# Patient Record
Sex: Male | Born: 1955 | Race: White | Hispanic: No | Marital: Married | Smoking: Never smoker
Health system: Southern US, Community
[De-identification: ages and names within clinical notes are randomized; demographics above are authoritative.]

## PROBLEM LIST (undated history)

## (undated) DIAGNOSIS — K219 Gastro-esophageal reflux disease without esophagitis: Secondary | ICD-10-CM

## (undated) DIAGNOSIS — I10 Essential (primary) hypertension: Secondary | ICD-10-CM

## (undated) DIAGNOSIS — E7800 Pure hypercholesterolemia, unspecified: Secondary | ICD-10-CM

## (undated) HISTORY — PX: FOOT SURGERY: SHX648

## (undated) HISTORY — PX: CHOLECYSTECTOMY: SHX55

---

## 2009-01-19 ENCOUNTER — Ambulatory Visit (HOSPITAL_COMMUNITY): Admission: RE | Admit: 2009-01-19 | Discharge: 2009-01-19 | Payer: Self-pay | Admitting: Family Medicine

## 2010-03-07 ENCOUNTER — Ambulatory Visit (HOSPITAL_COMMUNITY): Admission: RE | Admit: 2010-03-07 | Discharge: 2010-03-07 | Payer: Self-pay | Admitting: Family Medicine

## 2010-03-27 ENCOUNTER — Ambulatory Visit: Payer: Self-pay | Admitting: Internal Medicine

## 2010-03-27 DIAGNOSIS — R1012 Left upper quadrant pain: Secondary | ICD-10-CM

## 2010-03-27 DIAGNOSIS — R933 Abnormal findings on diagnostic imaging of other parts of digestive tract: Secondary | ICD-10-CM

## 2010-03-27 DIAGNOSIS — K219 Gastro-esophageal reflux disease without esophagitis: Secondary | ICD-10-CM | POA: Insufficient documentation

## 2010-03-27 DIAGNOSIS — Z8711 Personal history of peptic ulcer disease: Secondary | ICD-10-CM

## 2010-03-29 ENCOUNTER — Encounter: Payer: Self-pay | Admitting: Internal Medicine

## 2010-04-01 ENCOUNTER — Encounter: Payer: Self-pay | Admitting: Gastroenterology

## 2010-04-08 ENCOUNTER — Ambulatory Visit (HOSPITAL_COMMUNITY): Admission: RE | Admit: 2010-04-08 | Discharge: 2010-04-08 | Payer: Self-pay | Admitting: Internal Medicine

## 2010-04-08 ENCOUNTER — Ambulatory Visit: Payer: Self-pay | Admitting: Internal Medicine

## 2010-04-10 ENCOUNTER — Encounter: Payer: Self-pay | Admitting: Internal Medicine

## 2010-04-10 ENCOUNTER — Ambulatory Visit (HOSPITAL_COMMUNITY): Admission: RE | Admit: 2010-04-10 | Discharge: 2010-04-10 | Payer: Self-pay | Admitting: Internal Medicine

## 2010-04-11 ENCOUNTER — Encounter: Payer: Self-pay | Admitting: Internal Medicine

## 2010-04-24 ENCOUNTER — Encounter: Payer: Self-pay | Admitting: Internal Medicine

## 2010-05-15 ENCOUNTER — Ambulatory Visit: Payer: Self-pay | Admitting: Gastroenterology

## 2010-05-31 ENCOUNTER — Telehealth (INDEPENDENT_AMBULATORY_CARE_PROVIDER_SITE_OTHER): Payer: Self-pay

## 2010-06-07 ENCOUNTER — Encounter: Payer: Self-pay | Admitting: Gastroenterology

## 2010-08-21 ENCOUNTER — Ambulatory Visit (HOSPITAL_COMMUNITY): Admission: RE | Admit: 2010-08-21 | Discharge: 2010-08-21 | Payer: Self-pay | Admitting: Family Medicine

## 2010-12-31 NOTE — Letter (Signed)
Summary: External Other  External Other   Imported By: Peggyann Shoals 04/01/2010 12:58:30  _____________________________________________________________________  External Attachment:    Type:   Image     Comment:   External Document

## 2010-12-31 NOTE — Progress Notes (Signed)
Summary: lost rx  Phone Note Call from Patient Call back at Home Phone [phone]   Caller: Patient Summary of Call: pt called- he lost rx for hemorroids. wants to know if [facility] will [facility]. Initial call taken by: Hendricks Limes LPN,  June 01, 2951 10:02 AM     Appended Document: lost rx-hemorrhoids    Prescriptions: HYDROCORTISONE ACETATE 25 MG SUPP (HYDROCORTISONE ACETATE) anorectally two times a day for two weeks  #28 x 1   Entered and Authorized by:   Leanna Battles. Dixon Boos   Signed by:   Leanna Battles Lewis PA-C on 05/31/2010   Method used:   [facility]ronically to        [facility] [phone]* (retail)       [address]       [place], [place]  [address]       Ph: [phone] or [phone]       Fax: [phone]   RxID:   [id number]

## 2010-12-31 NOTE — Letter (Signed)
Summary: Patient Notice, Endo Biopsy Results  [facility]  [address]   [place], [place] [address]   Phone: [phone]  Fax: [phone]       Apr 11, 2010   Fernando Fisher [address] [place], [place]  [address] Oct 05, 1956    Dear [name],  I am pleased to inform you that the biopsies taken during your recent endoscopic examination did not show any evidence of cancer upon pathologic examination.  There was only some inflammation in your stomach..  Additional information/recommendations:  Continue with the treatment plan as outlined on the day of your exam.  Please call us if you are having persistent problems or have questions about your condition that have not been fully answered at this time.  Sincerely,    R. Roetta Sessions MD, FACP [facility] Ph: [phone]   Fax: [phone]   Appended Document: Patient Notice, Endo Biopsy Results letter already mailed on 04/10/10

## 2010-12-31 NOTE — Letter (Signed)
Summary: Patient Notice, Endo Biopsy Results  [facility]  [address]   [place], [place] [address]   Phone: [phone]  Fax: [phone]       Apr 10, 2010   Fernando Fisher [address] [place], [place]  [address] [birth year]    Dear Fernando Fisher,  I am pleased to inform you that the biopsies taken during your recent endoscopic examination did not show any evidence of cancer upon pathologic examination.  They did show mild inflammation.  Additional information/recommendations:  Continue with the treatment plan as outlined on the day of your exam.  Please call us if you are having persistent problems or have questions about your condition that have not been fully answered at this time.  Sincerely,    R. Roetta Sessions MD, FACP [facility] Ph: [phone]   Fax: [phone]   Appended Document: Patient Notice, Endo Biopsy Results letter mailed to pt

## 2010-12-31 NOTE — Assessment & Plan Note (Signed)
Summary: fu in 4-6 wks/ss   Visit Type:  f/u Primary Care Provider:  [facility]  Chief Complaint:  follow up- still has some abd pain.  History of Present Illness: Mr. Fernando Fisher is here for f/u. He had EGD in 5/11. He had normal esophagus, small to moderate size hiatal hernia, multiple fundal gland type polyps confimed by biopsy, mottled patchy erythema of the gastric mucosa diffusely of uncertain significance (query nonsteroidal anti-inflammatory drug effect), otherwise unremarkable stomach status post biopsy (benign). He was switched from prilosec to dexilant. He feels better. He has less abd pain, now just some on left side. Still pp component but also related to prolonged standing. He has cut back on acidic foods including ketchup. Denies melena, brbpr. C/O anorectal itching the last one week. He also had CT A/P which showed nonspecific 7mm liver lesion.   Current Medications (verified): 1)  Hydrochlorothiazide 25 Mg Tabs (Hydrochlorothiazide) .... 1/2 Tablet Daily 2)  Zetia 10 Mg Tabs (Ezetimibe) .... Take 1 Tablet By Mouth Once A Day 3)  Loratadine 10 Mg Tabs (Loratadine) .... Take 1 Tablet By Mouth Once A Day 4)  Multi-Vitamin .... Take 1 Tablet By Mouth Once A Day 5)  Fish Oil  1000mg  .... Take Four Tablets Daily 6)  Sucralfate 1 Gm/69ml Susp (Sucralfate) .Marland Kitchen.. 10cc By Mouth Qac and At Bedtime Prn 7)  Dexilant 60 Mg Cpdr (Dexlansoprazole) .... Once Daily  Allergies (verified): No Known Drug Allergies  Review of Systems      See HPI  Vital Signs:  Patient profile:   55 year old male Height:      66 inches Weight:      166 pounds BMI:     26.89 Temp:     98.3 degrees F oral Pulse rate:   76 / minute BP sitting:   134 / 92  (left arm) Cuff size:   regular  Vitals Entered By: Hendricks Limes LPN (May 15, 2010 2:40 PM)  Physical Exam  General:  Well developed, well nourished, no acute distress. Head:  Normocephalic and atraumatic. Eyes:  sclera  nonicteric Mouth:  OP moist Abdomen:  Bowel sounds normal.  Abdomen is soft, nontender, nondistended.  No rebound or guarding.  No hepatosplenomegaly, masses or hernias.  No abdominal bruits.  Extremities:  No clubbing, cyanosis, edema or deformities noted. Neurologic:  Alert and  oriented x4;  grossly normal neurologically. Skin:  Intact without significant lesions or rashes. Psych:  Alert and cooperative. Normal mood and affect.  Impression & Recommendations:  Problem # 1:  ABDOMINAL PAIN, LEFT UPPER QUADRANT (ICD-789.02)  Etiology remains unclear but he has noted improvement on Dexilant. ?secondary to mottled appearing stomach, hiatal hernia. Continue Dexilant 60mg  by mouth daily.   Orders: Est. Patient Level II ([address]) Immuno-chemical Fecal Occult (02725)  Problem # 2:  GERD (ICD-530.81)  Continue Dexilant.  Orders: Est. Patient Level II (36644)  Problem # 3:  ADENOCARCINOMA, COLON, FAMILY HX (ICD-V16.0)  Tried to get last TCS report and path from [facility] but not successful with first attempt. Will try again. Patient's mother had colon cancer in her 28s. Patient completed ifobt yesterday which was positive. ?secondary to ugi tract findings. Will try to retrieve report so that we can make decision regarding next TCS.   Orders: Est. Patient Level II (03474) Immuno-chemical Fecal Occult (25956)  Problem # 4:  Sx of HEMORRHOIDS (ICD-455.6)  Anorectal itching. Patient declined on DRE. Trial of anusol.  Orders: Est. Patient Level II ([address]756)  Immuno-chemical Fecal Occult ([address]) Prescriptions: HYDROCORTISONE ACETATE 25 MG SUPP (HYDROCORTISONE ACETATE) anorectally two times a day for two weeks  #28 x 1   Entered and Authorized by:   Leanna Battles. Dixon Boos   Signed by:   Leanna Battles Iker Nuttall PA-C on 05/15/2010   Method used:   Print then Give to Patient   RxID:   [id number]   Appended Document: fu in 4-6 wks/ss Please see if patient needs RX for Dexilant. Should be out of  samples soon. May give RX for Dexilant 60mg  by mouth daily, #30, 5 refills and rebate card.  Appended Document: fu in 4-6 wks/ss Spoke with pt's wife. She said she thought he already had Rx with refills, she will check it out and call back if he needs it. She said he was not eligible for the rebate card.  Appended Document: fu in 4-6 wks/ss I still need TCS report. Please f/u on this. We have requested twice now.   Appended Document: fu in 4-6 wks/ss tcs report in EMR, see 04/24/10 scan  Appended Document: fu in 4-6 wks/ss Review last TCS done 2007 at [facility]. Prep fair. Otherwise unremarkable. FH CRC mother, after age 35s. Heme positive stool may be seconary to ugi tract findings.  Recommend CBC to determine if any anemia. Then decide about next TCS.  Appended Document: fu in 4-6 wks/ss LMOM to call @ cell number, [phone], given by wife.  Appended Document: fu in 4-6 wks/ss Pt was informed. Declined blood work and said that he would get back with [place] later on.  Appended Document: fu in 4-6 wks/ss That would be his decision.

## 2010-12-31 NOTE — Assessment & Plan Note (Signed)
Summary: npp,abd pain,gu   Visit Type:  Initial Consult Referring Provider:  Patrica Duel Primary Care Provider:  Patrica Duel  Chief Complaint:  abdominal pain.  History of Present Illness: Fernando Fisher is a pleasant 55 y/o WM, patient of Dr. Nobie Putnam, who presents for further evaluation of luq abd pain of several months duration. Patient has h/o remote PUD, chronic GERD (recently on omeprazole 40mg  at bedtime and Dr. Nobie Putnam doubled). He c/o pp luq pain which occurs within one hour of meal. Recent increase in omeprazole did not help. He hurts also with standing/walking. Denies injury. He "feels" his food going down through his GI tract. He has "always" had stomach issues. His BMs are more frequent lately. He has 2-3 formed stools per day. No blood in stool or melena. Denies urinary problems. Denies back pain. 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 He denies chest pain, SOB.   He reports normal TCS at [facility] around 2007. His mother had colon cancer, age 68.  Labs 02/26/10--> CBC normal, CMET normal, amylase/lipase normal, H. Pylori negative. Abd U/S 03/07/10--> suboptimal view of liver and pancreas due to extensive bowel gas UGI series 03/07/10-->multiple small rounded filling defects at gastric fundus, ?gastric polyps, predominately sessile and smoothly marginated. Cannot exclude gastritis. Minimal impairment of esophageal dysmotility.  Current Medications (verified): 1)  Asa 81 Mg .... Take 1 Tablet By Mouth Once A Day 2)  Prilosec 20 Mg Cpdr (Omeprazole) .... Four At Bedtime. 3)  Hydrochlorothiazide 25 Mg Tabs (Hydrochlorothiazide) .... 1/2 Tablet Daily 4)  Zetia 10 Mg Tabs (Ezetimibe) .... Take 1 Tablet By Mouth Once A Day 5)  Loratadine 10 Mg Tabs (Loratadine) .... Take 1 Tablet By Mouth Once A Day 6)  Multi-Vitamin .... Take 1 Tablet By Mouth Once A Day 7)  Fish Oil  1000mg  .... Take Four Tablets Daily 8)  Aleve .... As Needed  Allergies (verified): No Known Drug  Allergies  Past History:  Past Medical History: GERD Hyperlipidemia Hypertension TCS, 3/07, [facility] Allergies  Past Surgical History: Cholecystectomy, 1999 Left foot, plantar fasciitis  Family History: Mother, colon cancer,  Fernando Fisher, age 57, diagnosed by Dr. Jena Gauss Father, prostate cancer, PUD, bleeding Mothers side several females with "male" and uterine cancer. Maternal aunt, breast cancer.  Social History: [employer]. Location manager for [employer]. Married. Grown son. Never smoked. No alcohol. No drugs.  Review of Systems General:  Denies fever, chills, sweats, anorexia, fatigue, weakness, and weight loss. Eyes:  Denies vision loss. ENT:  Denies nasal congestion, sore throat, hoarseness, and difficulty swallowing. CV:  Denies chest pains, angina, palpitations, dyspnea on exertion, and peripheral edema. Resp:  Denies dyspnea at rest, dyspnea with exercise, and cough. GI:  See HPI. GU:  Denies urinary burning and blood in urine. MS:  Denies joint pain / LOM. Derm:  Denies rash and itching. Neuro:  Denies weakness, frequent headaches, difficulty walking, memory loss, and confusion. Psych:  Denies depression and anxiety. Endo:  Denies unusual weight change. Heme:  Denies bruising and bleeding. Allergy:  Denies hives and rash.  Vital Signs:  Patient profile:   55 year old male Height:      66 inches Weight:      166 pounds BMI:     26.89 Temp:     98.2 degrees F oral Pulse rate:   80 / minute BP sitting:   118 / 80  (left arm) Cuff size:   regular  Vitals Entered By: Tyler Aas  Roseanne Reno LPN (March 27, 2010 1:56 PM)  Physical Exam  General:  Well developed, well nourished, no acute distress. Head:  Normocephalic and atraumatic. Eyes:  Conjunctivae pink, no scleral icterus.  Mouth:  Oropharyngeal mucosa moist, pink.  No lesions, erythema or exudate.    Neck:  Supple; no masses or thyromegaly. Lungs:  Clear throughout to auscultation. Heart:  Regular rate  and rhythm; no murmurs, rubs,  or bruits. Abdomen:  Bowel sounds normal.  Abdomen is soft, nondistended.  Mild LUQ, left mid abdominal tenderness. No rebound or guarding.  No hepatosplenomegaly, masses or hernias.  No abdominal bruits.  Extremities:  No clubbing, cyanosis, edema or deformities noted. Neurologic:  Alert and  oriented x4;  grossly normal neurologically. Skin:  Intact without significant lesions or rashes. Cervical Nodes:  No significant cervical adenopathy. Psych:  Alert and cooperative. Normal mood and affect.  Impression & Recommendations:  Problem # 1:  ABDOMINAL PAIN, LEFT UPPER QUADRANT (ICD-789.02)  2-3 month h/o luq abd pain. UGI series abnormal, ?gastric polyps/?gastritis. Abd pain with major pp component. Recommend EGD for further evaluation. He also has chronic GERD. Currently taking omeprazole 80mg  at bedtime. Advised him to take 40mg  before breakfast and 40mg  before evening meal for now. Likely can reduce to once daily, await EGD findings. EGD to be performed in near future.  Risks, alternatives, benefits including but not limited to risk of reaction to medications, bleeding, infection, and perforation addressed.  Patient voiced understanding and verbal consent obtained.   Orders: Consultation Level IV ([address])  Problem # 2:  PUD, HX OF (ICD-V12.71)  Remote history. H. Pylori serologies negative. EGD as planned.   Orders: Consultation Level IV (60454)  Problem # 3:  ADENOCARCINOMA, COLON, FAMILY HX (ICD-V16.0)  Mother diagnosed with CRC at age 25. Will retrieve last TCS records. If prep was good, consider next TCS at 10 years from his last one.   Orders: Consultation Level IV (09811) Prescriptions: SUCRALFATE 1 GM/10ML SUSP (SUCRALFATE) 10cc by mouth qac and at bedtime prn  #2 weeks x 0   Entered and Authorized by:   Leanna Battles. Dixon Boos   Signed by:   Leanna Battles Aynsley Fleet PA-C on 03/27/2010   Method used:   [facility]ron[facility]        [facility] [phone][facility]  (retail)       [address]       [place], [place]  [address]       Ph: [phone] or [phone]       Fax: [phone]   RxID:   [id number]  I would like to thank Dr. Nobie Putnam for allowing [place] to take part in the care of this nice patient.  Appended Document: npp,abd pain,gu reminder in computer  Appended Document: npp,abd pain,gu Still waiting for TCS and path report from [place] done in 2007.  Patient also needs to complete ifobt prior to next OV.  Appended Document: npp,abd pain,gu LMOM to call, iFOBT bottle at front.  Appended Document: npp,abd pain,gu Pt informed.  Appended Document: npp,abd pain,gu pt cancelled his appt w/RMR on 6/7 because of a conflict in his work schedule. I RSCed pt to 6/15 @2 :30p w/LSL to suit time frame of being seen within 4-6 weeks. Pt is aware of new appt time and date.

## 2010-12-31 NOTE — Letter (Signed)
Summary: EGD ORDER  EGD ORDER   Imported By: Ave Filter 03/27/2010 15:02:40  _____________________________________________________________________  External Attachment:    Type:   Image     Comment:   External Document

## 2010-12-31 NOTE — Letter (Signed)
Summary: External Other  External Other   Imported By: Peggyann Shoals 04/24/2010 09:33:45  _____________________________________________________________________  External Attachment:    Type:   Image     Comment:   External Document

## 2010-12-31 NOTE — Letter (Signed)
Summary: Internal Other  Internal Other   Imported By: Peggyann Shoals 03/29/2010 10:09:17  _____________________________________________________________________  External Attachment:    Type:   Image     Comment:   External Document

## 2010-12-31 NOTE — Miscellaneous (Signed)
Summary: Orders Update  Clinical Lists Changes  Orders: Added new Test order of T-CBC w/Diff (85025-10010) - Signed 

## 2011-03-10 ENCOUNTER — Other Ambulatory Visit (HOSPITAL_COMMUNITY): Payer: Self-pay | Admitting: Orthopedic Surgery

## 2011-03-10 DIAGNOSIS — R52 Pain, unspecified: Secondary | ICD-10-CM

## 2011-03-12 ENCOUNTER — Ambulatory Visit (HOSPITAL_COMMUNITY)
Admission: RE | Admit: 2011-03-12 | Discharge: 2011-03-12 | Disposition: A | Discharge status: Home / Self Care | Payer: PRIVATE HEALTH INSURANCE | Source: Ambulatory Visit | Attending: Orthopedic Surgery | Admitting: Orthopedic Surgery

## 2011-03-12 DIAGNOSIS — M719 Bursopathy, unspecified: Secondary | ICD-10-CM | POA: Insufficient documentation

## 2011-03-12 DIAGNOSIS — M25519 Pain in unspecified shoulder: Secondary | ICD-10-CM | POA: Insufficient documentation

## 2011-03-12 DIAGNOSIS — R52 Pain, unspecified: Secondary | ICD-10-CM

## 2011-03-12 DIAGNOSIS — M67919 Unspecified disorder of synovium and tendon, unspecified shoulder: Secondary | ICD-10-CM | POA: Insufficient documentation

## 2011-03-19 ENCOUNTER — Ambulatory Visit (HOSPITAL_COMMUNITY)
Admission: RE | Admit: 2011-03-19 | Discharge: 2011-03-19 | Disposition: A | Discharge status: Home / Self Care | Payer: PRIVATE HEALTH INSURANCE | Source: Ambulatory Visit | Attending: Specialist | Admitting: Specialist

## 2011-03-19 DIAGNOSIS — M25519 Pain in unspecified shoulder: Secondary | ICD-10-CM | POA: Insufficient documentation

## 2011-03-19 DIAGNOSIS — M25619 Stiffness of unspecified shoulder, not elsewhere classified: Secondary | ICD-10-CM | POA: Insufficient documentation

## 2011-03-19 DIAGNOSIS — I10 Essential (primary) hypertension: Secondary | ICD-10-CM | POA: Insufficient documentation

## 2011-03-19 DIAGNOSIS — IMO0001 Reserved for inherently not codable concepts without codable children: Secondary | ICD-10-CM | POA: Insufficient documentation

## 2011-03-19 DIAGNOSIS — M6281 Muscle weakness (generalized): Secondary | ICD-10-CM | POA: Insufficient documentation

## 2011-03-24 ENCOUNTER — Ambulatory Visit (HOSPITAL_COMMUNITY)
Admission: RE | Admit: 2011-03-24 | Discharge: 2011-03-24 | Disposition: A | Discharge status: Home / Self Care | Payer: PRIVATE HEALTH INSURANCE | Source: Ambulatory Visit | Attending: Family Medicine | Admitting: Family Medicine

## 2011-03-26 ENCOUNTER — Ambulatory Visit (HOSPITAL_COMMUNITY)
Admission: RE | Admit: 2011-03-26 | Discharge: 2011-03-26 | Disposition: A | Discharge status: Home / Self Care | Payer: PRIVATE HEALTH INSURANCE | Source: Ambulatory Visit | Attending: Family Medicine | Admitting: Family Medicine

## 2011-03-31 ENCOUNTER — Ambulatory Visit (HOSPITAL_COMMUNITY): Payer: PRIVATE HEALTH INSURANCE | Admitting: Specialist

## 2011-04-01 ENCOUNTER — Ambulatory Visit (HOSPITAL_COMMUNITY)
Admission: RE | Admit: 2011-04-01 | Discharge: 2011-04-01 | Disposition: A | Discharge status: Home / Self Care | Payer: PRIVATE HEALTH INSURANCE | Source: Ambulatory Visit | Attending: Specialist | Admitting: Specialist

## 2011-04-01 DIAGNOSIS — M6281 Muscle weakness (generalized): Secondary | ICD-10-CM | POA: Insufficient documentation

## 2011-04-01 DIAGNOSIS — I10 Essential (primary) hypertension: Secondary | ICD-10-CM | POA: Insufficient documentation

## 2011-04-01 DIAGNOSIS — M25619 Stiffness of unspecified shoulder, not elsewhere classified: Secondary | ICD-10-CM | POA: Insufficient documentation

## 2011-04-01 DIAGNOSIS — M25519 Pain in unspecified shoulder: Secondary | ICD-10-CM | POA: Insufficient documentation

## 2011-04-01 DIAGNOSIS — IMO0001 Reserved for inherently not codable concepts without codable children: Secondary | ICD-10-CM | POA: Insufficient documentation

## 2011-04-10 ENCOUNTER — Ambulatory Visit (HOSPITAL_COMMUNITY)
Admission: RE | Admit: 2011-04-10 | Discharge: 2011-04-10 | Disposition: A | Discharge status: Home / Self Care | Payer: PRIVATE HEALTH INSURANCE | Source: Ambulatory Visit | Attending: Family Medicine | Admitting: Family Medicine

## 2011-04-11 ENCOUNTER — Ambulatory Visit (HOSPITAL_COMMUNITY)
Admission: RE | Admit: 2011-04-11 | Discharge: 2011-04-11 | Disposition: A | Discharge status: Home / Self Care | Payer: PRIVATE HEALTH INSURANCE | Source: Ambulatory Visit | Attending: Family Medicine | Admitting: Family Medicine

## 2014-03-08 ENCOUNTER — Ambulatory Visit (HOSPITAL_COMMUNITY)
Admission: RE | Admit: 2014-03-08 | Discharge: 2014-03-08 | Disposition: A | Discharge status: Home / Self Care | Payer: PRIVATE HEALTH INSURANCE | Source: Ambulatory Visit | Attending: Internal Medicine | Admitting: Internal Medicine

## 2014-03-08 ENCOUNTER — Other Ambulatory Visit (HOSPITAL_COMMUNITY): Payer: Self-pay | Admitting: Internal Medicine

## 2014-03-08 DIAGNOSIS — R110 Nausea: Secondary | ICD-10-CM | POA: Insufficient documentation

## 2014-03-08 DIAGNOSIS — R109 Unspecified abdominal pain: Secondary | ICD-10-CM | POA: Insufficient documentation

## 2015-10-19 ENCOUNTER — Encounter (HOSPITAL_COMMUNITY): Payer: Self-pay

## 2015-10-19 ENCOUNTER — Emergency Department (HOSPITAL_COMMUNITY)
Admission: EM | Admit: 2015-10-19 | Discharge: 2015-10-19 | Disposition: A | Discharge status: Home / Self Care | Payer: PRIVATE HEALTH INSURANCE | Attending: Emergency Medicine | Admitting: Emergency Medicine

## 2015-10-19 DIAGNOSIS — Y9289 Other specified places as the place of occurrence of the external cause: Secondary | ICD-10-CM | POA: Insufficient documentation

## 2015-10-19 DIAGNOSIS — W260XXA Contact with knife, initial encounter: Secondary | ICD-10-CM | POA: Diagnosis not present

## 2015-10-19 DIAGNOSIS — Y9389 Activity, other specified: Secondary | ICD-10-CM | POA: Insufficient documentation

## 2015-10-19 DIAGNOSIS — S61211A Laceration without foreign body of left index finger without damage to nail, initial encounter: Secondary | ICD-10-CM | POA: Insufficient documentation

## 2015-10-19 DIAGNOSIS — Y998 Other external cause status: Secondary | ICD-10-CM | POA: Diagnosis not present

## 2015-10-19 DIAGNOSIS — Z7982 Long term (current) use of aspirin: Secondary | ICD-10-CM | POA: Insufficient documentation

## 2015-10-19 DIAGNOSIS — E7800 Pure hypercholesterolemia, unspecified: Secondary | ICD-10-CM | POA: Insufficient documentation

## 2015-10-19 DIAGNOSIS — S61219A Laceration without foreign body of unspecified finger without damage to nail, initial encounter: Secondary | ICD-10-CM

## 2015-10-19 DIAGNOSIS — I10 Essential (primary) hypertension: Secondary | ICD-10-CM | POA: Diagnosis not present

## 2015-10-19 HISTORY — DX: Essential (primary) hypertension: I10

## 2015-10-19 HISTORY — DX: Pure hypercholesterolemia, unspecified: E78.00

## 2015-10-19 MED ORDER — LIDOCAINE HCL (PF) 2 % IJ SOLN
2.0000 mL | Freq: Once | INTRAMUSCULAR | Status: AC
  Administered 2015-10-19: 2 mL
  Filled 2015-10-19: qty 10

## 2015-10-19 NOTE — ED Notes (Signed)
Pt made aware to return if symptoms worsen or if any life threatening symptoms occur.   

## 2015-10-19 NOTE — ED Notes (Signed)
Pt reports was working on insulation under his house and accidentally cut left index finger with knife.

## 2015-10-19 NOTE — ED Provider Notes (Signed)
CSN: [id number]     Arrival date & time 10/19/15  1517 History   First MD Initiated Contact with Patient 10/19/15 1546     Chief Complaint  Patient presents with  . Laceration     (Consider location/radiation/quality/duration/timing/severity/associated sxs/prior Treatment) Patient is a 59 y.o. male presenting with skin laceration. The history is provided by the patient.  Laceration Location:  Finger Finger laceration location:  L index finger Length (cm):  1 Depth:  Cutaneous (curved/flap laceration) Bleeding: controlled with pressure   Time since incident:  1 hour Laceration mechanism:  Knife Pain details:    Quality:  Throbbing   Severity:  Mild   Timing:  Constant   Progression:  Unchanged Foreign body present:  No foreign bodies Relieved by:  Pressure Worsened by:  Nothing tried Ineffective treatments:  None tried Tetanus status:  Up to date   Past Medical History  Diagnosis Date  . Hypertension   . Hypercholesterolemia    Past Surgical History  Procedure Laterality Date  . Cholecystectomy    . Foot surgery     No family history on file. Social History  Substance Use Topics  . Smoking status: Never Smoker   . Smokeless tobacco: None  . Alcohol Use: No    Review of Systems  Constitutional: Negative for fever and chills.  Respiratory: Negative for shortness of breath and wheezing.   Skin: Positive for wound.  Neurological: Negative for numbness.      Allergies  Review of patient's allergies indicates no known allergies.  Home Medications   Prior to Admission medications   Medication Sig Start Date End Date Taking? Authorizing Provider  aspirin EC 81 MG tablet Take 81 mg by mouth every evening.   Yes Historical Provider, MD  hydrochlorothiazide (HYDRODIURIL) 25 MG tablet Take 25 mg by mouth at bedtime.   Yes Historical Provider, MD  MAGNESIUM PO Take 1 tablet by mouth every evening.   Yes Historical Provider, MD  Omega-3 Fatty Acids (FISH OIL PO)  Take 1 capsule by mouth every evening.   Yes Historical Provider, MD  simvastatin (ZOCOR) 10 MG tablet Take 10 mg by mouth at bedtime.   Yes Historical Provider, MD   BP 154/82 mmHg  Pulse 94  Temp(Src) 98.2 F (36.8 C) (Oral)  Resp 13  Ht 5\' 6"  (1.676 m)  Wt 155 lb (70.308 kg)  BMI 25.03 kg/m2  SpO2 100% Physical Exam  Constitutional: He is oriented to person, place, and time. He appears well-developed and well-nourished.  HENT:  Head: Normocephalic.  Cardiovascular: Normal rate.   Pulmonary/Chest: Effort normal.  Musculoskeletal: He exhibits no tenderness.  Neurological: He is alert and oriented to person, place, and time. No sensory deficit.  Skin: Laceration noted.  1 cm curved laceration left distal volar index finger.  Distal sensation is intact.    ED Course  Procedures (including critical care time)  LACERATION REPAIR Performed by: Evalee Jefferson Authorized by: Evalee Jefferson Consent: Verbal consent obtained. Risks and benefits: risks, benefits and alternatives were discussed Consent given by: patient Patient identity confirmed: provided demographic data Prepped and Draped in normal sterile fashion Wound explored  Laceration Location: left index finger  Laceration Length: 1cm  No Foreign Bodies seen or palpated  Anesthesia: digital block  Local anesthetic: lidocaine 2% without epinephrine  Anesthetic total: 2 ml  Irrigation method: syringe Amount of cleaning: standard  Skin closure: Ethilon 4-0   Number of sutures: 4   Technique: Simple interrupted   Patient tolerance:  Patient tolerated the procedure well with no immediate complications.  Labs Review Labs Reviewed - No data to display  Imaging Review No results found. I have personally reviewed and evaluated these images and lab results as part of my medical decision-making.   EKG Interpretation None      MDM   Final diagnoses:  Laceration of finger of left hand, initial encounter     Wound care instructions given.  Pt advised to have sutures removed in 10 days,  Return here sooner for any signs of infection including redness, swelling, worse pain or drainage of pus.       Evalee Jefferson, PA-C 10/19/15 1628  Santiel Ferguson, MD 10/19/15 2227

## 2015-10-19 NOTE — ED Notes (Signed)
Dressing applied to finger 

## 2015-10-19 NOTE — Discharge Instructions (Signed)

## 2016-01-20 ENCOUNTER — Encounter (HOSPITAL_COMMUNITY): Payer: Self-pay | Admitting: Emergency Medicine

## 2016-01-20 ENCOUNTER — Emergency Department (HOSPITAL_COMMUNITY): Payer: PRIVATE HEALTH INSURANCE

## 2016-01-20 ENCOUNTER — Emergency Department (HOSPITAL_COMMUNITY)
Admission: EM | Admit: 2016-01-20 | Discharge: 2016-01-20 | Disposition: A | Discharge status: Home / Self Care | Payer: PRIVATE HEALTH INSURANCE | Attending: Emergency Medicine | Admitting: Emergency Medicine

## 2016-01-20 DIAGNOSIS — J111 Influenza due to unidentified influenza virus with other respiratory manifestations: Secondary | ICD-10-CM | POA: Insufficient documentation

## 2016-01-20 DIAGNOSIS — I10 Essential (primary) hypertension: Secondary | ICD-10-CM | POA: Diagnosis not present

## 2016-01-20 DIAGNOSIS — Z7982 Long term (current) use of aspirin: Secondary | ICD-10-CM | POA: Insufficient documentation

## 2016-01-20 DIAGNOSIS — E7800 Pure hypercholesterolemia, unspecified: Secondary | ICD-10-CM | POA: Diagnosis not present

## 2016-01-20 DIAGNOSIS — R000 Tachycardia, unspecified: Secondary | ICD-10-CM | POA: Diagnosis not present

## 2016-01-20 DIAGNOSIS — R509 Fever, unspecified: Secondary | ICD-10-CM | POA: Diagnosis present

## 2016-01-20 MED ORDER — IBUPROFEN 800 MG PO TABS: 800.0000 mg | ORAL_TABLET | Freq: Three times a day (TID) | ORAL | Status: DC

## 2016-01-20 MED ORDER — IBUPROFEN 800 MG PO TABS
800.0000 mg | ORAL_TABLET | Freq: Once | ORAL | Status: AC
  Administered 2016-01-20: 800 mg via ORAL
  Filled 2016-01-20: qty 1

## 2016-01-20 MED ORDER — OSELTAMIVIR PHOSPHATE 75 MG PO CAPS: 75.0000 mg | ORAL_CAPSULE | Freq: Two times a day (BID) | ORAL | Status: DC

## 2016-01-20 NOTE — ED Notes (Signed)
Patient c/o fever, body aches, and productive cough. Per patient temp 102 this morning. Patient states took tylenol last night but nothing this morning. Patient reports thick, dark mucus.

## 2016-01-20 NOTE — ED Provider Notes (Signed)
CSN: [id number]     Arrival date & time 01/20/16  0813 History   By signing my name below, I, Jolayne Panther, attest that this documentation has been prepared under the direction and in the presence of Noemi Chapel, MD. Electronically Signed: Jolayne Panther, Scribe. 01/20/2016. 9:10 AM.   Chief Complaint  Patient presents with  . Fever   The history is provided by the patient. No language interpreter was used.    HPI Comments: Fernando Fisher is a 60 y.o. male who presents to the Emergency Department complaining of sudden onset, gradually worsening productive cough with thick dark sputum which began about three days ago. Pt also notes associated myalgias, nasal congestion, and a fever which was measured at about 102 this morning, less than 48 hours. Pt also reports decreased oral intake but he has had fluids. He states that he took tylenol yesterday with no relief.    Past Medical History  Diagnosis Date  . Hypertension   . Hypercholesterolemia    Past Surgical History  Procedure Laterality Date  . Cholecystectomy    . Foot surgery     History reviewed. No pertinent family history. Social History  Substance Use Topics  . Smoking status: Never Smoker   . Smokeless tobacco: Never Used  . Alcohol Use: No    Review of Systems  Constitutional: Positive for fever.  HENT: Positive for congestion.   Respiratory: Positive for cough.   Musculoskeletal: Positive for myalgias.  All other systems reviewed and are negative.  Allergies  Review of patient's allergies indicates no known allergies.  Home Medications   Prior to Admission medications   Medication Sig Start Date End Date Taking? Authorizing Provider  aspirin EC 81 MG tablet Take 81 mg by mouth every evening.    Historical Provider, MD  hydrochlorothiazide (HYDRODIURIL) 25 MG tablet Take 25 mg by mouth at bedtime.    Historical Provider, MD  ibuprofen (ADVIL,MOTRIN) 800 MG tablet Take 1 tablet (800 mg total) by  mouth 3 (three) times daily. 01/20/16   Noemi Chapel, MD  MAGNESIUM PO Take 1 tablet by mouth every evening.    Historical Provider, MD  Omega-3 Fatty Acids (FISH OIL PO) Take 1 capsule by mouth every evening.    Historical Provider, MD  oseltamivir (TAMIFLU) 75 MG capsule Take 1 capsule (75 mg total) by mouth every 12 (twelve) hours. 01/20/16   Noemi Chapel, MD  simvastatin (ZOCOR) 10 MG tablet Take 10 mg by mouth at bedtime.    Historical Provider, MD   BP 143/84 mmHg  Pulse 120  Temp(Src) 101.5 F (38.6 C) (Oral)  Resp 20  Ht 5\' 6"  (1.676 m)  Wt 155 lb (70.308 kg)  BMI 25.03 kg/m2  SpO2 99% Physical Exam  Constitutional: He appears well-developed and well-nourished. No distress.  HENT:  Head: Normocephalic and atraumatic.  Mouth/Throat: Oropharynx is clear and moist. No oropharyngeal exudate.  Eyes: Conjunctivae and EOM are normal. Pupils are equal, round, and reactive to light. Right eye exhibits no discharge. Left eye exhibits no discharge. No scleral icterus.  Neck: Normal range of motion. Neck supple. No JVD present. No thyromegaly present.  Cardiovascular: Regular rhythm, normal heart sounds and intact distal pulses.  Exam reveals no gallop and no friction rub.   No murmur heard. Tachycardia  Pulmonary/Chest: Effort normal and breath sounds normal. No respiratory distress. He has no wheezes. He has no rales.  No increased work of breathing, no tachypnea, no accessory muscle use, no  respiratory distress, no abnormal lung sounds Occasional coughing Strong pulses   Abdominal: Soft. Bowel sounds are normal. He exhibits no distension and no mass. There is no tenderness.  Musculoskeletal: Normal range of motion. He exhibits no edema or tenderness.  Lymphadenopathy:    He has no cervical adenopathy.  Neurological: He is alert. Coordination normal.  Skin: Skin is warm and dry. No rash noted. He is not diaphoretic. No erythema.  Psychiatric: He has a normal mood and affect. His  behavior is normal.  Nursing note and vitals reviewed.   ED Course  Procedures  DIAGNOSTIC STUDIES:    Oxygen Saturation is 100% on RA, normal by my interpretation.   COORDINATION OF CARE:  8:28 AM Will review x ray. Will prescribe pt medication. Discussed treatment plan with pt at bedside and pt agreed to plan.   Imaging Review Dg Chest 2 View  01/20/2016  CLINICAL DATA:  Productive cough, fever, myalgias. EXAM: CHEST  2 VIEW COMPARISON:  03/08/2014. FINDINGS: Normal sized heart. Mildly tortuous aorta. Clear lungs. Mild central peribronchial thickening. Mild lower thoracic spine degenerative changes. Cholecystectomy clips. IMPRESSION: Mild bronchitic changes. Electronically Signed   By: Claudie Revering M.D.   On: 01/20/2016 09:07   I have personally reviewed and evaluated these images as part of my medical decision-making.   MDM   Final diagnoses:  Influenza    Fwever and tachycardia Pt given meds Has neg xray - classic influenza illnes Described tx plan to patient including indications for return - he has expressed his understanding Stable for d/c Pt in agreement with plan.  Meds given in ED:  Medications  ibuprofen (ADVIL,MOTRIN) tablet 800 mg (800 mg Oral Given 01/20/16 0926)    Discharge Medication List as of 01/20/2016  9:44 AM    START taking these medications   Details  ibuprofen (ADVIL,MOTRIN) 800 MG tablet Take 1 tablet (800 mg total) by mouth 3 (three) times daily., Starting 01/20/2016, Until Discontinued, Print    oseltamivir (TAMIFLU) 75 MG capsule Take 1 capsule (75 mg total) by mouth every 12 (twelve) hours., Starting 01/20/2016, Until Discontinued, Print            Noemi Chapel, MD 01/20/16 [phone]

## 2016-01-20 NOTE — Discharge Instructions (Signed)
Tylenol maximum dose is 1000mg  every 6 hours Motrin max dose is 800mg  every 8 hours

## 2016-03-03 ENCOUNTER — Telehealth: Payer: Self-pay

## 2016-03-03 NOTE — Telephone Encounter (Signed)
[place] SOMETIME TO DISCUSS TCS, RECEIVED LETTER

## 2016-03-05 NOTE — Telephone Encounter (Signed)
Pt is checking his insurance and he will call me back or come by to schedule the colonoscopy.

## 2016-03-06 ENCOUNTER — Telehealth: Payer: Self-pay

## 2016-03-06 NOTE — Telephone Encounter (Signed)
Gastroenterology Pre-Procedure Review  Request Date: 03/06/2016 Requesting Physician: Earleen Newport, NP  ( [place])   PATIENT REVIEW QUESTIONS: The patient responded to the following health history questions as indicated:    Pt said he had his last colonoscopy about 10 years ago at the [place] and it was normal He does have a family hx of colon cancer in his mom/ she was diagnosed in her early 74's  1. Diabetes Melitis: no 2. Joint replacements in the past 12 months: no 3. Major health problems in the past 3 months: no 4. Has an artificial valve or MVP: no 5. Has a defibrillator: no 6. Has been advised in past to take antibiotics in advance of a procedure like teeth cleaning: no 7. Family history of colon cancer: no  8. Alcohol Use: no 9. History of sleep apnea: no     MEDICATIONS & ALLERGIES:    Patient reports the following regarding taking any blood thinners:   Plavix? no Aspirin? YES Coumadin? no  Patient confirms/reports the following medications:  Current Outpatient Prescriptions  Medication Sig Dispense Refill  . ALPRAZolam (XANAX) 0.25 MG tablet Take 0.25 mg by mouth at bedtime as needed for anxiety. Takes only about one tablet monthly    . aspirin EC 81 MG tablet Take 81 mg by mouth every evening.    . hydrochlorothiazide (HYDRODIURIL) 25 MG tablet Take 25 mg by mouth at bedtime.    Marland Kitchen ibuprofen (ADVIL,MOTRIN) 800 MG tablet Take 1 tablet (800 mg total) by mouth 3 (three) times daily. 21 tablet 0  . MAGNESIUM PO Take 1 tablet by mouth every evening.    . NON FORMULARY Vitamin D 3   1000 IU   One tablet daily    . Omega-3 Fatty Acids (FISH OIL PO) Take 1 capsule by mouth every evening.    . simvastatin (ZOCOR) 10 MG tablet Take 10 mg by mouth at bedtime.    Marland Kitchen oseltamivir (TAMIFLU) 75 MG capsule Take 1 capsule (75 mg total) by mouth every 12 (twelve) hours. (Patient not taking: Reported on 03/06/2016) 10 capsule 0   No current facility-administered medications for this  visit.    Patient confirms/reports the following allergies:  No Known Allergies  No orders of the defined types were placed in this encounter.    AUTHORIZATION INFORMATION Primary Insurance:   ID #:   Group #:  Pre-Cert / Auth required: Pre-Cert / Auth #:   Secondary Insurance:   ID #:   Group #:  Pre-Cert / Auth required:  Pre-Cert / Auth #:   SCHEDULE INFORMATION: Procedure has been scheduled as follows:  Date: 04/02/2016            Time: 9:30 AM Location: [facility] Short Stay  This Gastroenterology Pre-Precedure Review Form is being routed to the following provider(s): R. Garfield Cornea, MD

## 2016-03-06 NOTE — Telephone Encounter (Signed)
See separate triage.  

## 2016-03-07 NOTE — Telephone Encounter (Signed)
Appropriate.

## 2016-03-11 ENCOUNTER — Other Ambulatory Visit: Payer: Self-pay

## 2016-03-11 DIAGNOSIS — Z1211 Encounter for screening for malignant neoplasm of colon: Secondary | ICD-10-CM

## 2016-03-11 DIAGNOSIS — Z800 Family history of malignant neoplasm of digestive organs: Secondary | ICD-10-CM

## 2016-03-11 MED ORDER — PEG 3350-KCL-NA BICARB-NACL 420 G PO SOLR: 4000.0000 mL | ORAL | Status: DC

## 2016-03-11 NOTE — Telephone Encounter (Signed)
Rx sent to the pharmacy and instructions mailed to pt.  

## 2016-03-31 ENCOUNTER — Telehealth: Payer: Self-pay

## 2016-03-31 NOTE — Telephone Encounter (Signed)
I called [facility] @ [phone] and spoke to Gallina who said the PA had already been done. PA for the colonoscopy is [id number].

## 2016-04-02 ENCOUNTER — Encounter (HOSPITAL_COMMUNITY): Payer: Self-pay | Admitting: *Deleted

## 2016-04-02 ENCOUNTER — Encounter (HOSPITAL_COMMUNITY)
Admission: RE | Disposition: A | Discharge status: Home / Self Care | Payer: Self-pay | Source: Ambulatory Visit | Attending: Internal Medicine

## 2016-04-02 ENCOUNTER — Ambulatory Visit (HOSPITAL_COMMUNITY)
Admission: RE | Admit: 2016-04-02 | Discharge: 2016-04-02 | Disposition: A | Discharge status: Home / Self Care | Payer: PRIVATE HEALTH INSURANCE | Source: Ambulatory Visit | Attending: Internal Medicine | Admitting: Internal Medicine

## 2016-04-02 DIAGNOSIS — D120 Benign neoplasm of cecum: Secondary | ICD-10-CM | POA: Diagnosis not present

## 2016-04-02 DIAGNOSIS — Z79899 Other long term (current) drug therapy: Secondary | ICD-10-CM | POA: Insufficient documentation

## 2016-04-02 DIAGNOSIS — Z860100 Personal history of colon polyps, unspecified: Secondary | ICD-10-CM | POA: Insufficient documentation

## 2016-04-02 DIAGNOSIS — D122 Benign neoplasm of ascending colon: Secondary | ICD-10-CM | POA: Diagnosis not present

## 2016-04-02 DIAGNOSIS — K5730 Diverticulosis of large intestine without perforation or abscess without bleeding: Secondary | ICD-10-CM | POA: Insufficient documentation

## 2016-04-02 DIAGNOSIS — I10 Essential (primary) hypertension: Secondary | ICD-10-CM | POA: Insufficient documentation

## 2016-04-02 DIAGNOSIS — Z1211 Encounter for screening for malignant neoplasm of colon: Secondary | ICD-10-CM | POA: Diagnosis present

## 2016-04-02 DIAGNOSIS — Z7982 Long term (current) use of aspirin: Secondary | ICD-10-CM | POA: Diagnosis not present

## 2016-04-02 DIAGNOSIS — E7800 Pure hypercholesterolemia, unspecified: Secondary | ICD-10-CM | POA: Insufficient documentation

## 2016-04-02 DIAGNOSIS — Z800 Family history of malignant neoplasm of digestive organs: Secondary | ICD-10-CM | POA: Diagnosis not present

## 2016-04-02 DIAGNOSIS — D124 Benign neoplasm of descending colon: Secondary | ICD-10-CM | POA: Diagnosis not present

## 2016-04-02 HISTORY — PX: COLONOSCOPY: SHX5424

## 2016-04-02 SURGERY — COLONOSCOPY
Anesthesia: Moderate Sedation

## 2016-04-02 MED ORDER — SODIUM CHLORIDE 0.9 % IJ SOLN
INTRAMUSCULAR | Status: DC | PRN
  Administered 2016-04-02: 15 mL

## 2016-04-02 MED ORDER — MIDAZOLAM HCL 5 MG/5ML IJ SOLN
INTRAMUSCULAR | Status: AC
  Filled 2016-04-02: qty 10

## 2016-04-02 MED ORDER — MEPERIDINE HCL 100 MG/ML IJ SOLN
INTRAMUSCULAR | Status: AC
  Filled 2016-04-02: qty 2

## 2016-04-02 MED ORDER — ONDANSETRON HCL 4 MG/2ML IJ SOLN
INTRAMUSCULAR | Status: DC | PRN
  Administered 2016-04-02: 4 mg via INTRAVENOUS

## 2016-04-02 MED ORDER — SODIUM CHLORIDE 0.9 % IV SOLN
INTRAVENOUS | Status: DC
  Administered 2016-04-02: 09:00:00 via INTRAVENOUS

## 2016-04-02 MED ORDER — SPOT INK MARKER SYRINGE KIT
PACK | SUBMUCOSAL | Status: DC | PRN
  Administered 2016-04-02: 2 mL via SUBMUCOSAL

## 2016-04-02 MED ORDER — MIDAZOLAM HCL 5 MG/5ML IJ SOLN
INTRAMUSCULAR | Status: DC | PRN
  Administered 2016-04-02: 1 mg via INTRAVENOUS
  Administered 2016-04-02: 2 mg via INTRAVENOUS
  Administered 2016-04-02 (×2): 1 mg via INTRAVENOUS

## 2016-04-02 MED ORDER — MEPERIDINE HCL 100 MG/ML IJ SOLN
INTRAMUSCULAR | Status: DC | PRN
  Administered 2016-04-02: 50 mg via INTRAVENOUS
  Administered 2016-04-02: 25 mg via INTRAVENOUS

## 2016-04-02 MED ORDER — ONDANSETRON HCL 4 MG/2ML IJ SOLN
INTRAMUSCULAR | Status: AC
  Filled 2016-04-02: qty 2

## 2016-04-02 MED ORDER — SIMETHICONE 40 MG/0.6ML PO SUSP
ORAL | Status: DC | PRN
  Administered 2016-04-02: 10:00:00

## 2016-04-02 NOTE — H&P (Signed)
@  [id number]   Primary Care Physician:  Purvis Kilts, MD Primary Gastroenterologist:  Dr. Gala Romney  Pre-Procedure History & Physical: HPI:  Fernando Fisher is a 60 y.o. male is here for a screening colonoscopy. Negative colonoscopy to [facility] 10 years ago. No bowel symptoms. Mother with colon cancer - diagnosed in her mid 60s  Past Medical History  Diagnosis Date  . Hypertension   . Hypercholesterolemia     Past Surgical History  Procedure Laterality Date  . Cholecystectomy    . Foot surgery      Prior to Admission medications   Medication Sig Start Date End Date Taking? Authorizing Provider  ALPRAZolam Duanne Moron) 0.25 MG tablet Take 0.25 mg by mouth at bedtime as needed for anxiety. Takes only about one tablet monthly   Yes Historical Provider, MD  aspirin EC 81 MG tablet Take 81 mg by mouth every evening.   Yes Historical Provider, MD  cholecalciferol (VITAMIN D) 1000 units tablet Take 1,000 Units by mouth daily.   Yes Historical Provider, MD  hydrochlorothiazide (HYDRODIURIL) 25 MG tablet Take 25 mg by mouth at bedtime.   Yes Historical Provider, MD  MAGNESIUM PO Take 1 tablet by mouth every evening.   Yes Historical Provider, MD  Omega-3 Fatty Acids (FISH OIL PO) Take 1 capsule by mouth every evening.   Yes Historical Provider, MD  polyethylene glycol-electrolytes (TRILYTE) 420 g solution Take 4,000 mLs by mouth as directed. 03/11/16  Yes Daneil Dolin, MD  simvastatin (ZOCOR) 10 MG tablet Take 10 mg by mouth at bedtime.   Yes Historical Provider, MD    Allergies as of 03/11/2016  . (No Known Allergies)    History reviewed. No pertinent family history.  Social History   Social History  . Marital Status: Married    Spouse Name: N/A  . Number of Children: N/A  . Years of Education: N/A   Occupational History  . Not on file.   Social History Main Topics  . Smoking status: Never Smoker   . Smokeless tobacco: Never Used  . Alcohol Use: No  . Drug Use: No  . Sexual  Activity: Not on file   Other Topics Concern  . Not on file   Social History Narrative    Review of Systems: See HPI, otherwise negative ROS  Physical Exam: BP 121/71 mmHg  Pulse 76  Temp(Src) 97.7 F (36.5 C) (Oral)  Resp 16  Ht 5\' 6"  (1.676 m)  Wt 152 lb (68.947 kg)  BMI 24.55 kg/m2  SpO2 100% General:   Alert,  Well-developed, well-nourished, pleasant and cooperative in NAD Head:  Normocephalic and atraumatic. Lungs:  Clear throughout to auscultation.   No wheezes, crackles, or rhonchi. No acute distress. Heart:  Regular rate and rhythm; no murmurs, clicks, rubs,  or gallops. Abdomen:  Soft, nontender and nondistended. No masses, hepatosplenomegaly or hernias noted. Normal bowel sounds, without guarding, and without rebound.   Msk:  Symmetrical without gross deformities. Normal posture. Pulses:  Normal pulses noted. Extremities:  Without clubbing or edema.  Impression/Plan: Fernando Fisher is now here to undergo a screening colonoscopy.  Average risk screening examination  Risks, benefits, limitations, imponderables and alternatives regarding colonoscopy have been reviewed with the patient. Questions have been answered. All parties agreeable.     Notice:  This dictation was prepared with Dragon dictation along with smaller phrase technology. Any transcriptional errors that result from this process are unintentional and may not be corrected upon review.

## 2016-04-02 NOTE — Op Note (Signed)
[facility] Patient Name: Fernando Fisher Procedure Date: 04/02/2016 9:40 AM MRN: [id number] Date of Birth: Mar 10, 1956 Attending MD: Norvel Richards , MD CSN: [id number] Age: 60 Admit Type: Outpatient Procedure:                Ileocolonoscopy with multiple snare polypectomy,                            injection therapy. Tattooing. Indications:              Screening patient at increased risk: Family history                            of 1st-degree relative with colorectal cancer at                            age 59 years (or older) Providers:                Norvel Richards, MD, Gwenlyn Fudge, RN, Randa Spike, Technician Referring MD:             Halford Chessman Medicines:                Midazolam 5 mg IV, Meperidine 75 mg IV Complications:            No immediate complications. Estimated Blood Loss:     Estimated blood loss was minimal. Procedure:                Pre-Anesthesia Assessment:                           - Prior to the procedure, a History and Physical                            was performed, and patient medications and                            allergies were reviewed. The patient's tolerance of                            previous anesthesia was also reviewed. The risks                            and benefits of the procedure and the sedation                            options and risks were discussed with the patient.                            All questions were answered, and informed consent                            was obtained. Prior Anticoagulants: The patient has  taken no previous anticoagulant or antiplatelet                            agents. ASA Grade Assessment: II - A patient with                            mild systemic disease. After reviewing the risks                            and benefits, the patient was deemed in                            satisfactory condition to undergo the  procedure.                           After obtaining informed consent, the colonoscope                            was passed under direct vision. Throughout the                            procedure, the patient's blood pressure, pulse, and                            oxygen saturations were monitored continuously. The                            EC-[id number] scope was introduced through                            the anus and advanced to the the cecum, identified                            by appendiceal orifice and ileocecal valve. The                            colonoscopy was performed without difficulty. The                            patient tolerated the procedure well. The quality                            of the bowel preparation was adequate. The                            ileocecal valve, appendiceal orifice, and rectum                            were photographed. Scope In: 9:54:17 AM Scope Out: 10:33:52 AM Scope Withdrawal Time: 0 hours 34 minutes 3 seconds  Total Procedure Duration: 0 hours 39 minutes 35 seconds  Findings:      The perianal and digital rectal examinations were normal.      (1) 65mm sessile polyps were found in the cecum. Marland Kitchen  This polyp was removed       with a hot snare. Resection and retrieval were complete. Estimated blood       loss: none.      A 5 mm polyp was found in the ascending colon. The polyp was removed       with a cold snare. Resection and retrieval were complete. Estimated       blood loss was minimal.      A 28 mm polyp was found in the descending colon. The polyp was       semi-pedunculated. Polypectomy was attempted, initially using a saline       injection-lift technique with a hot snare. Polyp lifted nicely off the       colonic wall with normal saline injected with the sclera needle.       Piecemeal snare polypectomy ensued.This intervention then required a       different device and polypectomy technique. Subsequent, the edges the        polypectomy site were touched up with the APC, circular probe at 25 J       each. Good hemostasis maintained. The mucosa 2 cm distal to the distal       extent of this lesion was tattooed with endo-spot.      Multiple medium-mouthed diverticula were found in the sigmoid colon. Impression:               tattooed                           - Five 5 mm polyps in the cecum, removed with a hot                            snare. Resected and retrieved.                           - One 5 mm polyp in the ascending colon, removed                            with a cold snare. Resected and retrieved.                           - One 28 mm polyp in the descending colon, removed                            piecemeal using a hot snare. Resected and retrieved.                           - Mild diverticulosis in the sigmoid colon. Moderate Sedation:      Moderate (conscious) sedation was administered by the endoscopy nurse       and supervised by the endoscopist. The following parameters were       monitored: oxygen saturation, heart rate, blood pressure, respiratory       rate, EKG, adequacy of pulmonary ventilation, and response to care.       Total physician intraservice time was 47 minutes.      . Recommendation:           - Patient has a contact number available for  emergencies. The signs and symptoms of potential                            delayed complications were discussed with the                            patient. Return to normal activities tomorrow.                            Written discharge instructions were provided to the                            patient.                           - Advance diet as tolerated.                           - Continue present medications.                           - Discontinue aspirin and NSAIDs for 10 days.                           - Patient has a contact number available for                            emergencies. The signs  and symptoms of potential                            delayed complications were discussed with the                            patient. Return to normal activities tomorrow.                            Written discharge instructions were provided to the                            patient.                           - Await pathology results.                           - Repeat colonoscopy date to be determined after                            pending pathology results are reviewed for                            surveillance.                           - Return to GI office in 6 months. Procedure Code(s):        --- Professional ---  [phone], Colonoscopy, flexible; with removal of                            tumor(s), polyp(s), or other lesion(s) by snare                            technique                           99152, Moderate sedation services provided by the                            same physician or other qualified health care                            professional performing the diagnostic or                            therapeutic service that the sedation supports,                            requiring the presence of an independent trained                            observer to assist in the monitoring of the                            patient's level of consciousness and physiological                            status; initial 15 minutes of intraservice time,                            patient age 79 years or older                           72, Moderate sedation services provided by the                            same physician or other qualified health care                            professional performing the diagnostic or                            therapeutic service that the sedation supports,                            requiring the presence of an independent trained                            observer to assist in the monitoring of the                             patient's level  of consciousness and physiological                            status; initial 15 minutes of intraservice time,                            patient age 29 years or older                           [phone], Moderate sedation services; each additional                            15 minutes intraservice time                           [phone], Moderate sedation services; each additional                            15 minutes intraservice time                           99153, Moderate sedation services; each additional                            15 minutes intraservice time                           99153, Moderate sedation services; each additional                            15 minutes intraservice time Diagnosis Code(s):        --- Professional ---                           Z80.0, Family history of malignant neoplasm of                            digestive organs                           D12.0, Benign neoplasm of cecum                           D12.2, Benign neoplasm of ascending colon                           D12.4, Benign neoplasm of descending colon                           K57.30, Diverticulosis of large intestine without                            perforation or abscess without bleeding CPT copyright 2016 [employer]. All rights reserved. The codes documented in this report are preliminary and upon coder review may  be revised to meet current compliance requirements. Cristopher Estimable. Norwin Aleman, MD Norvel Richards, MD 04/02/2016 2:37:54 PM This report has  been signed electronically. Number of Addenda: 0

## 2016-04-02 NOTE — Discharge Instructions (Signed)
Colonoscopy Discharge Instructions  Read the instructions outlined below and refer to this sheet in the next few weeks. These discharge instructions provide you with general information on caring for yourself after you leave the hospital. Your doctor may also give you specific instructions. While your treatment has been planned according to the most current medical practices available, unavoidable complications occasionally occur. If you have any problems or questions after discharge, call Dr. Gala Romney at [phone]. ACTIVITY  You may resume your regular activity, but move at a slower pace for the next 24 hours.   Take frequent rest periods for the next 24 hours.   Walking will help get rid of the air and reduce the bloated feeling in your belly (abdomen).   No driving for 24 hours (because of the medicine (anesthesia) used during the test).    Do not sign any important legal documents or operate any machinery for 24 hours (because of the anesthesia used during the test).  NUTRITION  Drink plenty of fluids.   You may resume your normal diet as instructed by your doctor.   Begin with a light meal and progress to your normal diet. Heavy or fried foods are harder to digest and may make you feel sick to your stomach (nauseated).   Avoid alcoholic beverages for 24 hours or as instructed.  MEDICATIONS  You may resume your normal medications unless your doctor tells you otherwise.  WHAT YOU CAN EXPECT TODAY  Some feelings of bloating in the abdomen.   Passage of more gas than usual.   Spotting of blood in your stool or on the toilet paper.  IF YOU HAD POLYPS REMOVED DURING THE COLONOSCOPY:  No aspirin products for 7 days or as instructed.   No alcohol for 7 days or as instructed.   Eat a soft diet for the next 24 hours.  FINDING OUT THE RESULTS OF YOUR TEST Not all test results are available during your visit. If your test results are not back during the visit, make an appointment  with your caregiver to find out the results. Do not assume everything is normal if you have not heard from your caregiver or the medical facility. It is important for you to follow up on all of your test results.  SEEK IMMEDIATE MEDICAL ATTENTION IF:  You have more than a spotting of blood in your stool.   Your belly is swollen (abdominal distention).   You are nauseated or vomiting.   You have a temperature over 101.   You have abdominal pain or discomfort that is severe or gets worse throughout the day.   Colon polyp and diverticulosis information provided  No aspirin 10 days did; then resume  Further recommendations to follow pending review of pathology report    Colon Polyps Polyps are lumps of extra tissue growing inside the body. Polyps can grow in the large intestine (colon). Most colon polyps are noncancerous (benign). However, some colon polyps can become cancerous over time. Polyps that are larger than a pea may be harmful. To be safe, caregivers remove and test all polyps. CAUSES  Polyps form when mutations in the genes cause your cells to grow and divide even though no more tissue is needed. RISK FACTORS There are a number of risk factors that can increase your chances of getting colon polyps. They include:  Being older than 50 years.  Family history of colon polyps or colon cancer.  Long-term colon diseases, such as colitis or Crohn disease.  Being  overweight.  Smoking.  Being inactive.  Drinking too much alcohol. SYMPTOMS  Most small polyps do not cause symptoms. If symptoms are present, they may include:  Blood in the stool. The stool may look dark red or black.  Constipation or diarrhea that lasts longer than 1 week. DIAGNOSIS People often do not know they have polyps until their caregiver finds them during a regular checkup. Your caregiver can use 4 tests to check for polyps:  Digital rectal exam. The caregiver wears gloves and feels inside the  rectum. This test would find polyps only in the rectum.  Barium enema. The caregiver puts a liquid called barium into your rectum before taking X-rays of your colon. Barium makes your colon look white. Polyps are dark, so they are easy to see in the X-ray pictures.  Sigmoidoscopy. A thin, flexible tube (sigmoidoscope) is placed into your rectum. The sigmoidoscope has a light and tiny camera in it. The caregiver uses the sigmoidoscope to look at the last third of your colon.  Colonoscopy. This test is like sigmoidoscopy, but the caregiver looks at the entire colon. This is the most common method for finding and removing polyps. TREATMENT  Any polyps will be removed during a sigmoidoscopy or colonoscopy. The polyps are then tested for cancer. PREVENTION  To help lower your risk of getting more colon polyps:  Eat plenty of fruits and vegetables. Avoid eating fatty foods.  Do not smoke.  Avoid drinking alcohol.  Exercise every day.  Lose weight if recommended by your caregiver.  Eat plenty of calcium and folate. Foods that are rich in calcium include milk, cheese, and broccoli. Foods that are rich in folate include chickpeas, kidney beans, and spinach. HOME CARE INSTRUCTIONS Keep all follow-up appointments as directed by your caregiver. You may need periodic exams to check for polyps. SEEK MEDICAL CARE IF: You notice bleeding during a bowel movement.   This information is not intended to replace advice given to you by your health care provider. Make sure you discuss any questions you have with your health care provider.   Document Released: 08/13/2004 Document Revised: 12/08/2014 Document Reviewed: 01/27/2012 Elsevier Interactive Patient Education 2016 [employer].   Diverticulosis Diverticulosis is the condition that develops when small pouches (diverticula) form in the wall of your colon. Your colon, or large intestine, is where water is absorbed and stool is formed. The pouches  form when the inside layer of your colon pushes through weak spots in the outer layers of your colon. CAUSES  No one knows exactly what causes diverticulosis. RISK FACTORS  Being older than 31. Your risk for this condition increases with age. Diverticulosis is rare in people younger than 40 years. By age 43, almost everyone has it.  Eating a low-fiber diet.  Being frequently constipated.  Being overweight.  Not getting enough exercise.  Smoking.  Taking over-the-counter pain medicines, like aspirin and ibuprofen. SYMPTOMS  Most people with diverticulosis do not have symptoms. DIAGNOSIS  Because diverticulosis often has no symptoms, health care providers often discover the condition during an exam for other colon problems. In many cases, a health care provider will diagnose diverticulosis while using a flexible scope to examine the colon (colonoscopy). TREATMENT  If you have never developed an infection related to diverticulosis, you may not need treatment. If you have had an infection before, treatment may include:  Eating more fruits, vegetables, and grains.  Taking a fiber supplement.  Taking a live bacteria supplement (probiotic).  Taking medicine to  relax your colon. HOME CARE INSTRUCTIONS   Drink at least 6-8 glasses of water each day to prevent constipation.  Try not to strain when you have a bowel movement.  Keep all follow-up appointments. If you have had an infection before:  Increase the fiber in your diet as directed by your health care provider or dietitian.  Take a dietary fiber supplement if your health care provider approves.  Only take medicines as directed by your health care provider. SEEK MEDICAL CARE IF:   You have abdominal pain.  You have bloating.  You have cramps.  You have not gone to the bathroom in 3 days. SEEK IMMEDIATE MEDICAL CARE IF:   Your pain gets worse.  Yourbloating becomes very bad.  You have a fever or chills, and  your symptoms suddenly get worse.  You begin vomiting.  You have bowel movements that are bloody or black. MAKE SURE YOU:  Understand these instructions.  Will watch your condition.  Will get help right away if you are not doing well or get worse.   This information is not intended to replace advice given to you by your health care provider. Make sure you discuss any questions you have with your health care provider.   Document Released: 08/14/2004 Document Revised: 11/22/2013 Document Reviewed: 10/12/2013 Elsevier Interactive Patient Education [employer].

## 2016-04-04 ENCOUNTER — Encounter (HOSPITAL_COMMUNITY): Payer: Self-pay | Admitting: Internal Medicine

## 2016-04-05 ENCOUNTER — Encounter: Payer: Self-pay | Admitting: Internal Medicine

## 2016-04-07 ENCOUNTER — Telehealth: Payer: Self-pay

## 2016-04-07 NOTE — Telephone Encounter (Signed)
Pt had TCS on 04/02/2016.  Wife states that he has not had a bowel movement since. States pt is eating and is able to pass. Denies any nausea.   Please advise

## 2016-04-08 NOTE — Telephone Encounter (Signed)
Would anticipate bowel function returning in the next day or so

## 2016-04-09 NOTE — Telephone Encounter (Signed)
Pt states he had a bowel movement yesterday. And is aware of repeat TCS in 6 months

## 2016-04-09 NOTE — Telephone Encounter (Signed)
Tried to call pt- Not home. LM with father to call the office.

## 2016-06-11 ENCOUNTER — Telehealth: Payer: Self-pay

## 2016-06-11 NOTE — Telephone Encounter (Signed)
Pt's wife is calling because he when to give blood but they told him that his HGB was low (10.4). He has been having pain in his stomach. Please advise

## 2016-06-11 NOTE — Telephone Encounter (Signed)
Communication noted. New GI symptoms have developed. I suggested office visit with [place].

## 2016-06-11 NOTE — Telephone Encounter (Signed)
Pt has an appointment to see LSL tomorrow at 3:30

## 2016-06-12 ENCOUNTER — Ambulatory Visit (INDEPENDENT_AMBULATORY_CARE_PROVIDER_SITE_OTHER): Payer: PRIVATE HEALTH INSURANCE | Admitting: Gastroenterology

## 2016-06-12 ENCOUNTER — Encounter: Payer: Self-pay | Admitting: Gastroenterology

## 2016-06-12 VITALS — BP 132/71 | HR 72 | Temp 98.0°F | Ht 66.0 in | Wt 162.4 lb

## 2016-06-12 DIAGNOSIS — K921 Melena: Secondary | ICD-10-CM

## 2016-06-12 DIAGNOSIS — D649 Anemia, unspecified: Secondary | ICD-10-CM

## 2016-06-12 DIAGNOSIS — R1013 Epigastric pain: Secondary | ICD-10-CM | POA: Diagnosis not present

## 2016-06-12 LAB — CBC WITH DIFFERENTIAL/PLATELET
Basophils Absolute: 0 cells/uL (ref 0–200)
Basophils Relative: 0 %
EOS PCT: 4 %
Eosinophils Absolute: 224 cells/uL (ref 15–500)
HEMATOCRIT: 34.5 % — AB (ref 38.5–50.0)
HEMOGLOBIN: 11 g/dL — AB (ref 13.2–17.1)
LYMPHS ABS: 1288 {cells}/uL (ref 850–3900)
LYMPHS PCT: 23 %
MCH: 24 pg — AB (ref 27.0–33.0)
MCHC: 31.9 g/dL — AB (ref 32.0–36.0)
MCV: 75.2 fL — ABNORMAL LOW (ref 80.0–100.0)
MONO ABS: 560 {cells}/uL (ref 200–950)
MPV: 8.8 fL (ref 7.5–12.5)
Monocytes Relative: 10 %
NEUTROS PCT: 63 %
Neutro Abs: 3528 cells/uL (ref 1500–7800)
PLATELETS: 298 10*3/uL (ref 140–400)
RBC: 4.59 MIL/uL (ref 4.20–5.80)
RDW: 16.1 % — AB (ref 11.0–15.0)
WBC: 5.6 10*3/uL (ref 3.8–10.8)

## 2016-06-12 NOTE — Patient Instructions (Signed)
Please have your labs done. We will call with results tomorrow and decide about the upper endoscopy.

## 2016-06-12 NOTE — Progress Notes (Signed)
Primary Care Physician:  Purvis Kilts, MD  Primary Gastroenterologist:  Garfield Cornea, MD   Chief Complaint  Patient presents with  . OTHER    Hemoglobin dropping/ could not give blood yesterday    HPI:  Fernando Fisher is a 60 y.o. male here for further evaluation of anemia. Went to give blood yesterday and his Hgb was too low. Reported Hgb 10.5. Patient reports given blood about 2-4 times per year since age 45. Has never been unable to donate blood.  He donated in January, April of this year. Labs done at work in May with Hgb of 11.6, MCV 79 unfortunately normal ranges not available. BUN 12, Creatinine 1.2, iron 20.  Heartburn on occasion, no dysphagia. Occasional nausea. Noted some epigastric pain at times with meals. No brbrp. Reports melena for a couple of days, happened several times, last episode was 2 days ago. No Pepto. Patient had a increased risk screening colonoscopy 03/2016 with multiple colon polyps removed. A larger polyp removed piecemeal and tattooed. Plan for repeat colonoscopy in six months. Patient complains of central/lower abd pain since colonoscopy up until about 1-2 weeks ago. BM regular.   Current Outpatient Prescriptions  Medication Sig Dispense Refill  . aspirin EC 81 MG tablet Take 81 mg by mouth every evening.    . cholecalciferol (VITAMIN D) 1000 units tablet Take 1,000 Units by mouth daily.    . hydrochlorothiazide (HYDRODIURIL) 25 MG tablet Take 25 mg by mouth at bedtime.    . Multiple Vitamin (MULTIVITAMIN) tablet Take 1 tablet by mouth daily.    . Omega-3 Fatty Acids (FISH OIL PO) Take 1 capsule by mouth every evening.    . simvastatin (ZOCOR) 10 MG tablet Take 10 mg by mouth at bedtime.    Marland Kitchen MAGNESIUM PO Take 1 tablet by mouth every evening.     No current facility-administered medications for this visit.    Allergies as of 06/12/2016  . (No Known Allergies)    Past Medical History  Diagnosis Date  . Hypertension   . Hypercholesterolemia      Past Surgical History  Procedure Laterality Date  . Cholecystectomy    . Foot surgery    . Colonoscopy N/A 04/02/2016    Procedure: COLONOSCOPY;  Surgeon: Daneil Dolin, MD;  Location: [facility];  Service: Endoscopy;  Laterality: N/A;  9:30 AM - moved to 9:45 - office to notify    Family History  Problem Relation Age of Onset  . Colon cancer      Social History   Social History  . Marital Status: Married    Spouse Name: N/A  . Number of Children: N/A  . Years of Education: N/A   Occupational History  . Not on file.   Social History Main Topics  . Smoking status: Never Smoker   . Smokeless tobacco: Never Used     Comment: Never smoked  . Alcohol Use: No  . Drug Use: No  . Sexual Activity: Not on file   Other Topics Concern  . Not on file   Social History Narrative      ROS:  General: Negative for anorexia, weight loss, fever, chills, fatigue, weakness. Eyes: Negative for vision changes.  ENT: Negative for hoarseness, difficulty swallowing , nasal congestion. CV: Negative for chest pain, angina, palpitations, dyspnea on exertion, peripheral edema.  Respiratory: Negative for dyspnea at rest, dyspnea on exertion, cough, sputum, wheezing.  GI: See history of present illness. GU:  Negative for dysuria,  hematuria, urinary incontinence, urinary frequency, nocturnal urination.  MS: Negative for joint pain, low back pain.  Derm: Negative for rash or itching.  Neuro: Negative for weakness, abnormal sensation, seizure, frequent headaches, memory loss, confusion.  Psych: Negative for anxiety, depression, suicidal ideation, hallucinations.  Endo: Negative for unusual weight change.  Heme: Negative for bruising or bleeding. Allergy: Negative for rash or hives.    Physical Examination:  BP 132/71 mmHg  Pulse 72  Temp(Src) 98 F (36.7 C) (Oral)  Ht 5\' 6"  (1.676 m)  Wt 162 lb 6.4 oz (73.664 kg)  BMI 26.22 kg/m2   General: Well-nourished, well-developed in no  acute distress.  Head: Normocephalic, atraumatic.   Eyes: Conjunctiva pink, no icterus. Mouth: Oropharyngeal mucosa moist and pink , no lesions erythema or exudate. Neck: Supple without thyromegaly, masses, or lymphadenopathy.  Lungs: Clear to auscultation bilaterally.  Heart: Regular rate and rhythm, no murmurs rubs or gallops.  Abdomen: Bowel sounds are normal, nontender, nondistended, no hepatosplenomegaly or masses, no abdominal bruits or    hernia , no rebound or guarding.   Rectal: not performed Extremities: No lower extremity edema. No clubbing or deformities.  Neuro: Alert and oriented x 4 , grossly normal neurologically.  Skin: Warm and dry, no rash or jaundice.   Psych: Alert and cooperative, normal mood and affect.  Labs: As above  Imaging Studies: No results found.

## 2016-06-13 LAB — FERRITIN: Ferritin: 7 ng/mL — ABNORMAL LOW (ref 20–380)

## 2016-06-13 LAB — IRON AND TIBC
%SAT: 5 % — ABNORMAL LOW (ref 15–60)
IRON: 26 ug/dL — AB (ref 50–180)
TIBC: 482 ug/dL — ABNORMAL HIGH (ref 250–425)
UIBC: 456 ug/dL — ABNORMAL HIGH (ref 125–400)

## 2016-06-13 NOTE — Progress Notes (Signed)
Quick Note:  Please let patient know that his labs indicate Iron Deficiency Anemia. His Hgb was 11 and his iron stores are low too.  We need to proceed with EGD with RMR as discussed during his OV yesterday.   Please schedule EGD with RMR for next week. Dx: IDA, melena, epig pain ______

## 2016-06-15 ENCOUNTER — Encounter: Payer: Self-pay | Admitting: Gastroenterology

## 2016-06-15 NOTE — Assessment & Plan Note (Signed)
61 y/o male presenting for anemia discovered at time of attempted blood donation. Patient has donated twice this year, in January and April. Generally donates blood 2-4 times per year for the past 40+ years and has never been declined for anemia. Recent colonoscopy as outlined above. Patient reports recent melena and UGI complaints. Recommend EGD for further evaluation. Patient reluctant initially. Offered labs first. I'm concerned about possible GI bleed rather than anemia related to blood donation. Further recommendations to follow.

## 2016-06-16 ENCOUNTER — Other Ambulatory Visit: Payer: Self-pay

## 2016-06-16 DIAGNOSIS — D509 Iron deficiency anemia, unspecified: Secondary | ICD-10-CM

## 2016-06-16 DIAGNOSIS — K921 Melena: Secondary | ICD-10-CM

## 2016-06-16 NOTE — Progress Notes (Signed)
CC'ED TO PCP 

## 2016-06-18 ENCOUNTER — Other Ambulatory Visit: Payer: Self-pay

## 2016-06-18 ENCOUNTER — Telehealth: Payer: Self-pay | Admitting: Internal Medicine

## 2016-06-18 ENCOUNTER — Ambulatory Visit (HOSPITAL_COMMUNITY)
Admission: RE | Admit: 2016-06-18 | Discharge: 2016-06-18 | Disposition: A | Discharge status: Home / Self Care | Payer: PRIVATE HEALTH INSURANCE | Source: Ambulatory Visit | Attending: Internal Medicine | Admitting: Internal Medicine

## 2016-06-18 ENCOUNTER — Encounter (HOSPITAL_COMMUNITY): Payer: Self-pay | Admitting: *Deleted

## 2016-06-18 ENCOUNTER — Encounter (HOSPITAL_COMMUNITY)
Admission: RE | Disposition: A | Discharge status: Home / Self Care | Payer: Self-pay | Source: Ambulatory Visit | Attending: Internal Medicine

## 2016-06-18 DIAGNOSIS — D649 Anemia, unspecified: Secondary | ICD-10-CM | POA: Insufficient documentation

## 2016-06-18 DIAGNOSIS — Z79899 Other long term (current) drug therapy: Secondary | ICD-10-CM | POA: Insufficient documentation

## 2016-06-18 DIAGNOSIS — I10 Essential (primary) hypertension: Secondary | ICD-10-CM | POA: Diagnosis not present

## 2016-06-18 DIAGNOSIS — Z7982 Long term (current) use of aspirin: Secondary | ICD-10-CM | POA: Insufficient documentation

## 2016-06-18 DIAGNOSIS — E7800 Pure hypercholesterolemia, unspecified: Secondary | ICD-10-CM | POA: Insufficient documentation

## 2016-06-18 DIAGNOSIS — D509 Iron deficiency anemia, unspecified: Secondary | ICD-10-CM

## 2016-06-18 DIAGNOSIS — R1013 Epigastric pain: Secondary | ICD-10-CM

## 2016-06-18 DIAGNOSIS — K921 Melena: Secondary | ICD-10-CM

## 2016-06-18 HISTORY — PX: ESOPHAGOGASTRODUODENOSCOPY: SHX5428

## 2016-06-18 HISTORY — DX: Gastro-esophageal reflux disease without esophagitis: K21.9

## 2016-06-18 SURGERY — EGD (ESOPHAGOGASTRODUODENOSCOPY)
Anesthesia: Moderate Sedation

## 2016-06-18 MED ORDER — MEPERIDINE HCL 100 MG/ML IJ SOLN
INTRAMUSCULAR | Status: DC | PRN
  Administered 2016-06-18: 50 mg via INTRAVENOUS

## 2016-06-18 MED ORDER — SODIUM CHLORIDE 0.9 % IV SOLN
INTRAVENOUS | Status: DC
  Administered 2016-06-18: 07:00:00 via INTRAVENOUS

## 2016-06-18 MED ORDER — ONDANSETRON HCL 4 MG/2ML IJ SOLN
INTRAMUSCULAR | Status: AC
  Filled 2016-06-18: qty 2

## 2016-06-18 MED ORDER — LIDOCAINE VISCOUS 2 % MT SOLN
OROMUCOSAL | Status: DC | PRN
  Administered 2016-06-18: 3 mL via OROMUCOSAL

## 2016-06-18 MED ORDER — LIDOCAINE VISCOUS 2 % MT SOLN
OROMUCOSAL | Status: AC
  Filled 2016-06-18: qty 15

## 2016-06-18 MED ORDER — MEPERIDINE HCL 100 MG/ML IJ SOLN
INTRAMUSCULAR | Status: AC
  Filled 2016-06-18: qty 2

## 2016-06-18 MED ORDER — MIDAZOLAM HCL 5 MG/5ML IJ SOLN
INTRAMUSCULAR | Status: AC
  Filled 2016-06-18: qty 10

## 2016-06-18 MED ORDER — MIDAZOLAM HCL 5 MG/5ML IJ SOLN
INTRAMUSCULAR | Status: DC | PRN
  Administered 2016-06-18: 1 mg via INTRAVENOUS
  Administered 2016-06-18: 2 mg via INTRAVENOUS

## 2016-06-18 MED ORDER — SIMETHICONE 40 MG/0.6ML PO SUSP
ORAL | Status: DC | PRN
  Administered 2016-06-18: 08:00:00

## 2016-06-18 MED ORDER — ONDANSETRON HCL 4 MG/2ML IJ SOLN
INTRAMUSCULAR | Status: DC | PRN
  Administered 2016-06-18: 4 mg via INTRAVENOUS

## 2016-06-18 NOTE — Telephone Encounter (Signed)
Bubba Hales, RN called back to say that they got the order in and not to worry about it.

## 2016-06-18 NOTE — H&P (View-Only) (Signed)
Primary Care Physician:  Purvis Kilts, MD  Primary Gastroenterologist:  Garfield Cornea, MD   Chief Complaint  Patient presents with  . OTHER    Hemoglobin dropping/ could not give blood yesterday    HPI:  Fernando Fisher is a 60 y.o. male here for further evaluation of anemia. Went to give blood yesterday and his Hgb was too low. Reported Hgb 10.5. Patient reports given blood about 2-4 times per year since age 62. Has never been unable to donate blood.  He donated in January, April of this year. Labs done at work in May with Hgb of 11.6, MCV 79 unfortunately normal ranges not available. BUN 12, Creatinine 1.2, iron 20.  Heartburn on occasion, no dysphagia. Occasional nausea. Noted some epigastric pain at times with meals. No brbrp. Reports melena for a couple of days, happened several times, last episode was 2 days ago. No Pepto. Patient had a increased risk screening colonoscopy 03/2016 with multiple colon polyps removed. A larger polyp removed piecemeal and tattooed. Plan for repeat colonoscopy in six months. Patient complains of central/lower abd pain since colonoscopy up until about 1-2 weeks ago. BM regular.   Current Outpatient Prescriptions  Medication Sig Dispense Refill  . aspirin EC 81 MG tablet Take 81 mg by mouth every evening.    . cholecalciferol (VITAMIN D) 1000 units tablet Take 1,000 Units by mouth daily.    . hydrochlorothiazide (HYDRODIURIL) 25 MG tablet Take 25 mg by mouth at bedtime.    . Multiple Vitamin (MULTIVITAMIN) tablet Take 1 tablet by mouth daily.    . Omega-3 Fatty Acids (FISH OIL PO) Take 1 capsule by mouth every evening.    . simvastatin (ZOCOR) 10 MG tablet Take 10 mg by mouth at bedtime.    Marland Kitchen MAGNESIUM PO Take 1 tablet by mouth every evening.     No current facility-administered medications for this visit.    Allergies as of 06/12/2016  . (No Known Allergies)    Past Medical History  Diagnosis Date  . Hypertension   . Hypercholesterolemia      Past Surgical History  Procedure Laterality Date  . Cholecystectomy    . Foot surgery    . Colonoscopy N/A 04/02/2016    Procedure: COLONOSCOPY;  Surgeon: Daneil Dolin, MD;  Location: [facility];  Service: Endoscopy;  Laterality: N/A;  9:30 AM - moved to 9:45 - office to notify    Family History  Problem Relation Age of Onset  . Colon cancer      Social History   Social History  . Marital Status: Married    Spouse Name: N/A  . Number of Children: N/A  . Years of Education: N/A   Occupational History  . Not on file.   Social History Main Topics  . Smoking status: Never Smoker   . Smokeless tobacco: Never Used     Comment: Never smoked  . Alcohol Use: No  . Drug Use: No  . Sexual Activity: Not on file   Other Topics Concern  . Not on file   Social History Narrative      ROS:  General: Negative for anorexia, weight loss, fever, chills, fatigue, weakness. Eyes: Negative for vision changes.  ENT: Negative for hoarseness, difficulty swallowing , nasal congestion. CV: Negative for chest pain, angina, palpitations, dyspnea on exertion, peripheral edema.  Respiratory: Negative for dyspnea at rest, dyspnea on exertion, cough, sputum, wheezing.  GI: See history of present illness. GU:  Negative for dysuria,  hematuria, urinary incontinence, urinary frequency, nocturnal urination.  MS: Negative for joint pain, low back pain.  Derm: Negative for rash or itching.  Neuro: Negative for weakness, abnormal sensation, seizure, frequent headaches, memory loss, confusion.  Psych: Negative for anxiety, depression, suicidal ideation, hallucinations.  Endo: Negative for unusual weight change.  Heme: Negative for bruising or bleeding. Allergy: Negative for rash or hives.    Physical Examination:  BP 132/71 mmHg  Pulse 72  Temp(Src) 98 F (36.7 C) (Oral)  Ht 5\' 6"  (1.676 m)  Wt 162 lb 6.4 oz (73.664 kg)  BMI 26.22 kg/m2   General: Fisher-nourished, Fisher-developed in no  acute distress.  Head: Normocephalic, atraumatic.   Eyes: Conjunctiva pink, no icterus. Mouth: Oropharyngeal mucosa moist and pink , no lesions erythema or exudate. Neck: Supple without thyromegaly, masses, or lymphadenopathy.  Lungs: Clear to auscultation bilaterally.  Heart: Regular rate and rhythm, no murmurs rubs or gallops.  Abdomen: Bowel sounds are normal, nontender, nondistended, no hepatosplenomegaly or masses, no abdominal bruits or    hernia , no rebound or guarding.   Rectal: not performed Extremities: No lower extremity edema. No clubbing or deformities.  Neuro: Alert and oriented x 4 , grossly normal neurologically.  Skin: Warm and dry, no rash or jaundice.   Psych: Alert and cooperative, normal mood and affect.  Labs: As above  Imaging Studies: No results found.

## 2016-06-18 NOTE — Op Note (Signed)
[facility] Patient Name: Fernando Fisher Procedure Date: 06/18/2016 7:38 AM MRN: [id number] Date of Birth: [birth year] Attending MD: Norvel Richards , MD CSN: [id number] Age: 60 Admit Type: Outpatient Procedure:                Upper GI endoscopy - diagnostic Indications:              Melena; anemia Providers:                Norvel Richards, MD, Otis Peak B. Sharon Seller, RN,                            Isabella Stalling, Technician Referring MD:              Medicines:                Midazolam 3 mg IV, Meperidine 75 mg IV Complications:            No immediate complications. Estimated blood loss:                            None. Estimated Blood Loss:     Estimated blood loss: none. Procedure:                Pre-Anesthesia Assessment:                           - Prior to the procedure, a History and Physical                            was performed, and patient medications and                            allergies were reviewed. The patient's tolerance of                            previous anesthesia was also reviewed. The risks                            and benefits of the procedure and the sedation                            options and risks were discussed with the patient.                            All questions were answered, and informed consent                            was obtained. Prior Anticoagulants: The patient has                            taken no previous anticoagulant or antiplatelet                            agents. ASA Grade Assessment: II - A patient with  mild systemic disease. After reviewing the risks                            and benefits, the patient was deemed in                            satisfactory condition to undergo the procedure.                           After obtaining informed consent, the endoscope was                            passed under direct vision. Throughout the                            procedure, the  patient's blood pressure, pulse, and                            oxygen saturations were monitored continuously. The                            Endoscope was introduced through the mouth, and                            advanced to the second part of duodenum. The upper                            GI endoscopy was accomplished without difficulty.                            The patient tolerated the procedure well. The upper                            GI endoscopy was accomplished without difficulty.                            The patient tolerated the procedure well. The                            patient tolerated the procedure well. Scope In: 7:59:24 AM Scope Out: 8:03:17 AM Total Procedure Duration: 0 hours 3 minutes 53 seconds  Findings:      normal-appearing esophagus. Stomach empty. Normal gastric mucosa. Patent       pylorus. Normal first and second portion of the duodenum. Impression:               Normal EGD Moderate Sedation:      Moderate (conscious) sedation was administered by the endoscopy nurse       and supervised by the endoscopist. The following parameters were       monitored: oxygen saturation, heart rate, blood pressure, and response       to care. Total physician intraservice time was 15 minutes. Recommendation:           - Patient has a contact number available for  emergencies. The signs and symptoms of potential                            delayed complications were discussed with the                            patient. Return to normal activities tomorrow.                            Written discharge instructions were provided to the                            patient.                           - Advance diet as tolerated.                           - Continue present medications.                           - check H. pylori serologies. If positive will                            treat. If negative, will proceed with capsule study                             the small intestine. Follow-up colonoscopy later in                            the year per plan. Procedure Code(s):        --- Professional ---                           [phone], Esophagogastroduodenoscopy, flexible,                            transoral; diagnostic, including collection of                            specimen(s) by brushing or washing, when performed                            (separate procedure)                           99152, Moderate sedation services provided by the                            same physician or other qualified health care                            professional performing the diagnostic or                            therapeutic service that the sedation supports,  requiring the presence of an independent trained                            observer to assist in the monitoring of the                            patient's level of consciousness and physiological                            status; initial 15 minutes of intraservice time,                            patient age 84 years or older Diagnosis Code(s):        --- Professional ---                           K92.1, Melena (includes Hematochezia) CPT copyright 2016 American [employer] Association. All rights reserved. The codes documented in this report are preliminary and upon coder review may  be revised to meet current compliance requirements. Cristopher Estimable. Bebe Moncure, MD Norvel Richards, MD 06/18/2016 9:01:39 AM This report has been signed electronically. Number of Addenda: 0

## 2016-06-18 NOTE — Telephone Encounter (Signed)
Bubba Hales, RN from Endo called to say that they drew the blood for H Pylori labs and since patient was discharged they couldn't put the order in and asked if we could put the order in so the lab would release it.

## 2016-06-18 NOTE — Interval H&P Note (Signed)
History and Physical Interval Note:  06/18/2016 7:49 AM  Fernando Fisher  has presented today for surgery, with the diagnosis of IDA,MELENA,EPIG PAIN  The various methods of treatment have been discussed with the patient and family. After consideration of risks, benefits and other options for treatment, the patient has consented to  Procedure(s) with comments: ESOPHAGOGASTRODUODENOSCOPY (EGD) (N/A) - 745 as a surgical intervention .  The patient's history has been reviewed, patient examined, no change in status, stable for surgery.  I have reviewed the patient's chart and labs.  Questions were answered to the patient's satisfaction.     Fernando Fisher  No change. No dysphagia. Diagnostic EGD per plan.  The risks, benefits, limitations, imponderables and alternatives regarding both EGD have been reviewed with the patient. Questions have been answered. All parties agreeable.

## 2016-06-18 NOTE — Telephone Encounter (Signed)
Lab order done 

## 2016-06-18 NOTE — Discharge Instructions (Signed)
EGD Discharge instructions Please read the instructions outlined below and refer to this sheet in the next few weeks. These discharge instructions provide you with general information on caring for yourself after you leave the hospital. Your doctor may also give you specific instructions. While your treatment has been planned according to the most current medical practices available, unavoidable complications occasionally occur. If you have any problems or questions after discharge, please call your doctor. ACTIVITY  You may resume your regular activity but move at a slower pace for the next 24 hours.   Take frequent rest periods for the next 24 hours.   Walking will help expel (get rid of) the air and reduce the bloated feeling in your abdomen.   No driving for 24 hours (because of the anesthesia (medicine) used during the test).   You may shower.   Do not sign any important legal documents or operate any machinery for 24 hours (because of the anesthesia used during the test).  NUTRITION  Drink plenty of fluids.   You may resume your normal diet.   Begin with a light meal and progress to your normal diet.   Avoid alcoholic beverages for 24 hours or as instructed by your caregiver.  MEDICATIONS  You may resume your normal medications unless your caregiver tells you otherwise.  WHAT YOU CAN EXPECT TODAY  You may experience abdominal discomfort such as a feeling of fullness or gas pains.  FOLLOW-UP  Your doctor will discuss the results of your test with you.  SEEK IMMEDIATE MEDICAL ATTENTION IF ANY OF THE FOLLOWING OCCUR:  Excessive nausea (feeling sick to your stomach) and/or vomiting.   Severe abdominal pain and distention (swelling).   Trouble swallowing.   Temperature over 101 F (37.8 C).   Rectal bleeding or vomiting of blood.     We'll check H. pylori serologies  May need small bowel capsule study to complete evaluation  Colonoscopy planned for later in the  year.

## 2016-06-19 LAB — H. PYLORI ANTIBODY, IGG: H Pylori IgG: <0.9 U/mL (ref 0.0–0.8)

## 2016-06-20 ENCOUNTER — Encounter (HOSPITAL_COMMUNITY): Payer: Self-pay | Admitting: Internal Medicine

## 2016-06-20 ENCOUNTER — Telehealth: Payer: Self-pay | Admitting: Internal Medicine

## 2016-06-20 NOTE — Telephone Encounter (Signed)
I spoke with the pt.  

## 2016-06-20 NOTE — Telephone Encounter (Signed)
PATIENT RETURNED CALL FROM THIS OFFICE.  [phone]

## 2016-06-24 ENCOUNTER — Other Ambulatory Visit: Payer: Self-pay

## 2016-06-24 DIAGNOSIS — D6489 Other specified anemias: Secondary | ICD-10-CM

## 2016-06-27 ENCOUNTER — Ambulatory Visit (HOSPITAL_COMMUNITY)
Admission: RE | Admit: 2016-06-27 | Discharge: 2016-06-27 | Disposition: A | Discharge status: Home / Self Care | Payer: PRIVATE HEALTH INSURANCE | Source: Ambulatory Visit | Attending: Internal Medicine | Admitting: Internal Medicine

## 2016-06-27 ENCOUNTER — Encounter (HOSPITAL_COMMUNITY)
Admission: RE | Disposition: A | Discharge status: Home / Self Care | Payer: Self-pay | Source: Ambulatory Visit | Attending: Internal Medicine

## 2016-06-27 ENCOUNTER — Encounter (HOSPITAL_COMMUNITY): Payer: Self-pay | Admitting: *Deleted

## 2016-06-27 DIAGNOSIS — D649 Anemia, unspecified: Secondary | ICD-10-CM | POA: Diagnosis not present

## 2016-06-27 DIAGNOSIS — R1013 Epigastric pain: Secondary | ICD-10-CM | POA: Diagnosis not present

## 2016-06-27 DIAGNOSIS — K921 Melena: Secondary | ICD-10-CM | POA: Diagnosis not present

## 2016-06-27 HISTORY — PX: GIVENS CAPSULE STUDY: SHX5432

## 2016-06-27 SURGERY — IMAGING PROCEDURE, GI TRACT, INTRALUMINAL, VIA CAPSULE

## 2016-06-30 NOTE — Op Note (Signed)
Small Bowel Givens Capsule Study Procedure date:  06/27/2016  Referring Provider:  Dr. Gala Romney PCP:  Dr. Hilma Favors, Betsy Coder, MD  Indication for procedure:  Anemia, melena, epigastric pain. Status post normal EGD, plans for colonoscopy later this year.  Patient data:  Wt: 162 lb (73.664 kg) Ht: 5\' 6"  (1.676 m) Waist: N/A  Findings:  Complete study, somewhat of a rapid transit time. Lymphangiectasias mildly throughout. Several specks of blood vs debris noted. No active bleeding or obvious sequela of bleeding noted. No masses, polyps, erosions, ulcers noted.  First Gastric image:  00:01:21 First Duodenal image: 00:17:15 First Ileo-Cecal Valve image: N/A First Cecal image: 02:44:25 Gastric Passage time: 00 h 15 m Small Bowel Passage time:  02 h 27 m  Summary & Recommendations: No active bleeding or obvious sequelae of recent bleeding. Possible occasional/rare blood flecks without identified source. Monitor H/H for change, colonoscopy this year as previously planned. If no source of anemia noted, possibe sputtering/intermittent bleeding episodes vs multifactorial cause. Consider referral to hematology for IDA management if continued anemia over the long term.   Walden Field, AGNP-C Adult & Gerontological Nurse Practitioner [facility]

## 2016-07-01 ENCOUNTER — Encounter (HOSPITAL_COMMUNITY): Payer: Self-pay | Admitting: Internal Medicine

## 2016-07-03 ENCOUNTER — Telehealth: Payer: Self-pay | Admitting: Internal Medicine

## 2016-07-03 NOTE — Telephone Encounter (Signed)
Pt called to speak with JL. He was following up on his results from his procedure. I told him that she would be back in the morning and let her know that he had called. [phone]

## 2016-07-04 NOTE — Telephone Encounter (Signed)
Please tell him a preliminary read has been done but RMR needs to sign off and he's out of town until Monday. Help me remember to follow-up with patient next week when read is final

## 2016-07-04 NOTE — Telephone Encounter (Signed)
Routing to EG for capsule results.

## 2016-07-08 NOTE — Telephone Encounter (Signed)
Routing to EG as a reminder.

## 2016-07-09 NOTE — Telephone Encounter (Signed)
Please tell the patient his capsule did not show any active bleeding or signs of recent bleeding. No masses, ulcers. Possible rare flecks of dried blood. At this point we will plan to do his colonoscopy later this year as planned. If we do not find a source of bleeding it is likely from a combination of causes and he will need to be monitored and given iron and/or blood as needed for anemia. Keep his follow-up appointment here.

## 2016-07-10 NOTE — Telephone Encounter (Signed)
pts wife Silva Bandy is aware. Pt is not scheduled for a follow up appt yet. Eric, when should he follow up? November? pts wife wants to know if its ok for him to take otc iron?

## 2016-07-11 NOTE — Telephone Encounter (Signed)
Recommend ferrous sulfate 325mg  BID. Hold off on blood donations until further notice.  Repeat CBC, iron/tibc, ferritin in 2 months. Return to the office one week after labs in 2 months, we will schedule him for his colonoscopy after that visit.

## 2016-07-16 NOTE — Telephone Encounter (Signed)
PATIENT ALREADY SCHEDULED FOR FOLLOW UP VISIT

## 2016-07-16 NOTE — Telephone Encounter (Signed)
Pt is aware. Lab orders on file.  Please schedule ov in 2 months.

## 2016-07-24 ENCOUNTER — Other Ambulatory Visit: Payer: Self-pay | Admitting: Gastroenterology

## 2016-07-24 DIAGNOSIS — D649 Anemia, unspecified: Secondary | ICD-10-CM

## 2016-08-20 ENCOUNTER — Other Ambulatory Visit: Payer: Self-pay

## 2016-08-20 DIAGNOSIS — D649 Anemia, unspecified: Secondary | ICD-10-CM

## 2016-09-10 ENCOUNTER — Other Ambulatory Visit: Payer: Self-pay

## 2016-09-10 ENCOUNTER — Encounter: Payer: Self-pay | Admitting: Nurse Practitioner

## 2016-09-10 ENCOUNTER — Ambulatory Visit: Payer: PRIVATE HEALTH INSURANCE | Admitting: Nurse Practitioner

## 2016-09-10 ENCOUNTER — Ambulatory Visit (INDEPENDENT_AMBULATORY_CARE_PROVIDER_SITE_OTHER): Payer: PRIVATE HEALTH INSURANCE | Admitting: Nurse Practitioner

## 2016-09-10 VITALS — BP 137/87 | HR 83 | Temp 97.9°F | Ht 66.0 in | Wt 163.6 lb

## 2016-09-10 DIAGNOSIS — Z860100 Personal history of colon polyps, unspecified: Secondary | ICD-10-CM

## 2016-09-10 DIAGNOSIS — D649 Anemia, unspecified: Secondary | ICD-10-CM

## 2016-09-10 LAB — CBC WITH DIFFERENTIAL/PLATELET
BASOS PCT: 0 %
Basophils Absolute: 0 cells/uL (ref 0–200)
EOS ABS: 83 {cells}/uL (ref 15–500)
Eosinophils Relative: 1 %
HEMATOCRIT: 40.4 % (ref 38.5–50.0)
HEMOGLOBIN: 14 g/dL (ref 13.2–17.1)
LYMPHS ABS: 1660 {cells}/uL (ref 850–3900)
LYMPHS PCT: 20 %
MCH: 29 pg (ref 27.0–33.0)
MCHC: 34.7 g/dL (ref 32.0–36.0)
MCV: 83.6 fL (ref 80.0–100.0)
MONO ABS: 747 {cells}/uL (ref 200–950)
MPV: 8.6 fL (ref 7.5–12.5)
Monocytes Relative: 9 %
NEUTROS ABS: 5810 {cells}/uL (ref 1500–7800)
Neutrophils Relative %: 70 %
Platelets: 240 10*3/uL (ref 140–400)
RBC: 4.83 MIL/uL (ref 4.20–5.80)
RDW: 18.5 % — AB (ref 11.0–15.0)
WBC: 8.3 10*3/uL (ref 3.8–10.8)

## 2016-09-10 NOTE — Assessment & Plan Note (Signed)
The patient has had a recent couple bouts of anemia found to be iron deficiency. He has not had blood work checked recently. He has had colonoscopy, endoscopy, and Givens capsule. No overt cause of bleeding found. He has been taking oral iron. I will check a CBC, iron studies today. If he continues with persistent anemia we will refer him to hematology for further evaluation and possible IV infusions. Return for follow-up in 3 months.

## 2016-09-10 NOTE — Assessment & Plan Note (Signed)
The patient recently had a colonoscopy in which multiple polyps removed including a 28 mm polyp. 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 Previous colonoscopy 10 years ago was clean. Patient has family history of colon cancer including in his mother and his maternal aunt. Recommended 6 month repeat colonoscopy. We will proceed with colonoscopy at this time.  Proceed with TCS with Dr. Gala Romney in near future: the risks, benefits, and alternatives have been discussed with the patient in detail. The patient states understanding and desires to proceed.  Patient is not on any anticoagulants, anxiolytics, chronic pain medications, or antidepressants. Conscious sedation should be adequate for his procedure as it was for his last procedure.

## 2016-09-10 NOTE — Progress Notes (Signed)
Referring Provider: Sharilyn Sites, MD Primary Care Physician:  Purvis Kilts, MD Primary GI:  Dr. Gala Romney  Chief Complaint  Patient presents with  . Colonoscopy    needs repeat in Nov  . Abdominal Pain    left abd pain    HPI:   Fernando Fisher is a 60 y.o. male who presents for repeat colonoscopy and left-sided abdominal pain. Last colonoscopy was completed 04/02/2016 which found a 5 mm sessile polyp in the cecum, 5 mm polyp in the ascending colon, both of which were removed. Additionally, a 28 mm polyp was found in the descending colon and was described as semi-pedunculated which is removed by combination techniques including piecemeal snare polypectomy, APC, and tattooing with Endo spot. Also noted mild diverticulosis in the sigmoid colon. 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 Recommended 6 month repeat colonoscopy.  The patient was last seen in our office 06/12/2016 for anemia, melena, abdominal pain. Labs drawn indicate iron deficiency anemia and he was scheduled for upper endoscopy and possible Givens capsule endoscopy. EGD was completed 06/18/2016 and found to be essentially normal. H. pylori serologies were negative. A Givens capsule endoscopy was completed 06/27/2016 and found complete study, somewhat rapid transit time, lymphangiectasia is mildly throughout, no active bleeding or obvious sequela of bleeding noted, no masses, polyps, erosions, ulcers noted. Recommended monitor hemoglobin for changes, possible sputtering/intermittent bleeding versus multifactorial cause, consider referral to hematology for IDA management if continued persistent anemia.  Today he states he's doing ok overall. Has some left-sided abdominal pain. Typically has a bowel movement daily to every other day but has not had a bowel movement in 4 days. Likely somewhat constipated. His energy is good. Denies N/V, hematochezia, melena. Does have darker stools but they are  still formed. Denies chest pain, dyspnea, dizziness, lightheadedness, syncope, near syncope. Denies any other upper or lower GI symptoms.   Past Medical History:  Diagnosis Date  . GERD (gastroesophageal reflux disease)   . Hypercholesterolemia   . Hypertension     Past Surgical History:  Procedure Laterality Date  . CHOLECYSTECTOMY    . COLONOSCOPY N/A 04/02/2016   RMR: diverticulosis sigmoid colon, six 44mm polyps removed, 30mm polyp in desc colon removed piecemeal using hot snare. tattooed. TCS 10/2016. tubular adenomas  . ESOPHAGOGASTRODUODENOSCOPY N/A 06/18/2016   Procedure: ESOPHAGOGASTRODUODENOSCOPY (EGD);  Surgeon: Daneil Dolin, MD;  Location: [facility];  Service: Endoscopy;  Laterality: N/A;  745  . FOOT SURGERY    . GIVENS CAPSULE STUDY N/A 06/27/2016   Procedure: GIVENS CAPSULE STUDY;  Surgeon: Daneil Dolin, MD;  Location: [facility];  Service: Endoscopy;  Laterality: N/A;  0700    Current Outpatient Prescriptions  Medication Sig Dispense Refill  . aspirin EC 81 MG tablet Take 81 mg by mouth every evening.    . cholecalciferol (VITAMIN D) 1000 units tablet Take 1,000 Units by mouth daily.    . hydrochlorothiazide (HYDRODIURIL) 25 MG tablet Take 12.5 mg by mouth at bedtime.     . Magnesium 250 MG TABS Take 1 tablet by mouth daily.    . Multiple Vitamin (MULTIVITAMIN) tablet Take 1 tablet by mouth daily.    . NON FORMULARY Take 1 tablet by mouth 2 (two) times daily. OTC iron (pt unsure of dose)    . Omega-3 Fatty Acids (FISH OIL PO) Take 1 capsule by mouth every evening.    . simvastatin (ZOCOR) 10 MG tablet Take 10 mg by  mouth at bedtime.     No current facility-administered medications for this visit.     Allergies as of 09/10/2016  . (No Known Allergies)    Family History  Problem Relation Age of Onset  . Colon cancer Mother     early 7s    Social History   Social History  . Marital status: Married    Spouse name: N/A  . Number of children: N/A    . Years of education: N/A   Social History Main Topics  . Smoking status: Never Smoker  . Smokeless tobacco: Never Used     Comment: Never smoked  . Alcohol use No  . Drug use: No  . Sexual activity: Not Asked   Other Topics Concern  . None   Social History Narrative  . None    Review of Systems: General: Negative for anorexia, weight loss, fever, chills, fatigue, weakness. ENT: Negative for hoarseness, difficulty swallowing. CV: Negative for chest pain, angina, palpitations, peripheral edema.  Respiratory: Negative for dyspnea at rest, cough, sputum, wheezing.  GI: See history of present illness. Derm: Negative for rash or itching.  Endo: Negative for unusual weight change.  Heme: Negative for bruising or bleeding. Allergy: Negative for rash or hives.   Physical Exam: BP 137/87   Pulse 83   Temp 97.9 F (36.6 C) (Oral)   Ht 5\' 6"  (1.676 m)   Wt 163 lb 9.6 oz (74.2 kg)   BMI 26.41 kg/m  General:   Alert and oriented. Pleasant and cooperative. Well-nourished and well-developed.  Eyes:  Without icterus, sclera clear and conjunctiva pink.  Ears:  Normal auditory acuity. Cardiovascular:  S1, S2 present without murmurs appreciated. Extremities without clubbing or edema. Respiratory:  Clear to auscultation bilaterally. No wheezes, rales, or rhonchi. No distress.  Gastrointestinal:  +BS, soft, non-tender and non-distended. No HSM noted. No guarding or rebound. No masses appreciated.  Rectal:  Deferred  Musculoskalatal:  Symmetrical without gross deformities. Neurologic:  Alert and oriented x4;  grossly normal neurologically. Psych:  Alert and cooperative. Normal mood and affect. Heme/Lymph/Immune: No excessive bruising noted.    09/10/2016 3:40 PM   Disclaimer: This note was dictated with voice recognition software. Similar sounding words can inadvertently be transcribed and may not be corrected upon review.

## 2016-09-10 NOTE — Patient Instructions (Signed)
1. Have your blood drawn when you're able to. 2. We will schedule your procedure for you. 3. Further recommendations to be made after your procedure. 4. Return for follow-up in 3 months.

## 2016-09-11 LAB — IRON AND TIBC
%SAT: 18 % (ref 15–60)
IRON: 73 ug/dL (ref 50–180)
TIBC: 401 ug/dL (ref 250–425)
UIBC: 328 ug/dL (ref 125–400)

## 2016-09-11 LAB — FERRITIN: Ferritin: 33 ng/mL (ref 20–380)

## 2016-09-11 NOTE — Progress Notes (Signed)
cc'ed to pcp °

## 2016-09-16 ENCOUNTER — Telehealth: Payer: Self-pay | Admitting: Internal Medicine

## 2016-09-16 NOTE — Telephone Encounter (Signed)
Please advise if lab results are back

## 2016-09-16 NOTE — Telephone Encounter (Signed)
Pt's wife called asking for his lab results he had done last Wednesday. I told her that the nurse wasn't in today and the labs may not be available yet. It normally takes 7-10 business days, but I would let the nurse know that she had called. [phone]

## 2016-09-18 NOTE — Telephone Encounter (Signed)
They are and were resulted and commented on. It should have gone to you, but wanted to double check. Thanks

## 2016-09-19 NOTE — Telephone Encounter (Signed)
Pt is aware of results. 

## 2016-10-01 ENCOUNTER — Encounter (HOSPITAL_COMMUNITY)
Admission: RE | Disposition: A | Discharge status: Home / Self Care | Payer: Self-pay | Source: Ambulatory Visit | Attending: Internal Medicine

## 2016-10-01 ENCOUNTER — Encounter (HOSPITAL_COMMUNITY): Payer: Self-pay

## 2016-10-01 ENCOUNTER — Ambulatory Visit (HOSPITAL_COMMUNITY)
Admission: RE | Admit: 2016-10-01 | Discharge: 2016-10-01 | Disposition: A | Discharge status: Home / Self Care | Payer: PRIVATE HEALTH INSURANCE | Source: Ambulatory Visit | Attending: Internal Medicine | Admitting: Internal Medicine

## 2016-10-01 DIAGNOSIS — Z7982 Long term (current) use of aspirin: Secondary | ICD-10-CM | POA: Diagnosis not present

## 2016-10-01 DIAGNOSIS — Z86010 Personal history of colonic polyps: Secondary | ICD-10-CM

## 2016-10-01 DIAGNOSIS — I10 Essential (primary) hypertension: Secondary | ICD-10-CM | POA: Insufficient documentation

## 2016-10-01 DIAGNOSIS — Z1211 Encounter for screening for malignant neoplasm of colon: Secondary | ICD-10-CM | POA: Insufficient documentation

## 2016-10-01 DIAGNOSIS — E7800 Pure hypercholesterolemia, unspecified: Secondary | ICD-10-CM | POA: Diagnosis not present

## 2016-10-01 DIAGNOSIS — K219 Gastro-esophageal reflux disease without esophagitis: Secondary | ICD-10-CM | POA: Diagnosis not present

## 2016-10-01 DIAGNOSIS — Z79899 Other long term (current) drug therapy: Secondary | ICD-10-CM | POA: Diagnosis not present

## 2016-10-01 DIAGNOSIS — D120 Benign neoplasm of cecum: Secondary | ICD-10-CM | POA: Diagnosis not present

## 2016-10-01 DIAGNOSIS — K5730 Diverticulosis of large intestine without perforation or abscess without bleeding: Secondary | ICD-10-CM | POA: Insufficient documentation

## 2016-10-01 HISTORY — PX: COLONOSCOPY: SHX5424

## 2016-10-01 SURGERY — COLONOSCOPY
Anesthesia: Moderate Sedation

## 2016-10-01 MED ORDER — ONDANSETRON HCL 4 MG/2ML IJ SOLN
INTRAMUSCULAR | Status: DC | PRN
  Administered 2016-10-01: 4 mg via INTRAVENOUS

## 2016-10-01 MED ORDER — STERILE WATER FOR IRRIGATION IR SOLN
Status: DC | PRN
  Administered 2016-10-01: 2.5 mL

## 2016-10-01 MED ORDER — ONDANSETRON HCL 4 MG/2ML IJ SOLN
INTRAMUSCULAR | Status: AC
  Filled 2016-10-01: qty 2

## 2016-10-01 MED ORDER — MEPERIDINE HCL 100 MG/ML IJ SOLN
INTRAMUSCULAR | Status: DC | PRN
  Administered 2016-10-01: 25 mg via INTRAVENOUS
  Administered 2016-10-01: 50 mg via INTRAVENOUS

## 2016-10-01 MED ORDER — MEPERIDINE HCL 100 MG/ML IJ SOLN
INTRAMUSCULAR | Status: AC
  Filled 2016-10-01: qty 2

## 2016-10-01 MED ORDER — MIDAZOLAM HCL 5 MG/5ML IJ SOLN
INTRAMUSCULAR | Status: AC
  Filled 2016-10-01: qty 10

## 2016-10-01 MED ORDER — SODIUM CHLORIDE 0.9 % IV SOLN
INTRAVENOUS | Status: DC
  Administered 2016-10-01: 07:00:00 via INTRAVENOUS

## 2016-10-01 MED ORDER — MIDAZOLAM HCL 5 MG/5ML IJ SOLN
INTRAMUSCULAR | Status: DC | PRN
  Administered 2016-10-01 (×2): 2 mg via INTRAVENOUS
  Administered 2016-10-01: 1 mg via INTRAVENOUS

## 2016-10-01 NOTE — H&P (View-Only) (Signed)
Referring Provider: Sharilyn Sites, MD Primary Care Physician:  Purvis Kilts, MD Primary GI:  Dr. Gala Romney  Chief Complaint  Patient presents with  . Colonoscopy    needs repeat in Nov  . Abdominal Pain    left abd pain    HPI:   Fernando Fisher is a 60 y.o. male who presents for repeat colonoscopy and left-sided abdominal pain. Last colonoscopy was completed 04/02/2016 which found a 5 mm sessile polyp in the cecum, 5 mm polyp in the ascending colon, both of which were removed. Additionally, a 28 mm polyp was found in the descending colon and was described as semi-pedunculated which is removed by combination techniques including piecemeal snare polypectomy, APC, and tattooing with Endo spot. Also noted mild diverticulosis in the sigmoid colon. 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 Recommended 6 month repeat colonoscopy.  The patient was last seen in our office 06/12/2016 for anemia, melena, abdominal pain. Labs drawn indicate iron deficiency anemia and he was scheduled for upper endoscopy and possible Givens capsule endoscopy. EGD was completed 06/18/2016 and found to be essentially normal. H. pylori serologies were negative. A Givens capsule endoscopy was completed 06/27/2016 and found complete study, somewhat rapid transit time, lymphangiectasia is mildly throughout, no active bleeding or obvious sequela of bleeding noted, no masses, polyps, erosions, ulcers noted. Recommended monitor hemoglobin for changes, possible sputtering/intermittent bleeding versus multifactorial cause, consider referral to hematology for IDA management if continued persistent anemia.  Today he states he's doing ok overall. Has some left-sided abdominal pain. Typically has a bowel movement daily to every other day but has not had a bowel movement in 4 days. Likely somewhat constipated. His energy is good. Denies N/V, hematochezia, melena. Does have darker stools but they are  still formed. Denies chest pain, dyspnea, dizziness, lightheadedness, syncope, near syncope. Denies any other upper or lower GI symptoms.   Past Medical History:  Diagnosis Date  . GERD (gastroesophageal reflux disease)   . Hypercholesterolemia   . Hypertension     Past Surgical History:  Procedure Laterality Date  . CHOLECYSTECTOMY    . COLONOSCOPY N/A 04/02/2016   RMR: diverticulosis sigmoid colon, six 67mm polyps removed, 21mm polyp in desc colon removed piecemeal using hot snare. tattooed. TCS 10/2016. tubular adenomas  . ESOPHAGOGASTRODUODENOSCOPY N/A 06/18/2016   Procedure: ESOPHAGOGASTRODUODENOSCOPY (EGD);  Surgeon: Daneil Dolin, MD;  Location: [facility];  Service: Endoscopy;  Laterality: N/A;  745  . FOOT SURGERY    . GIVENS CAPSULE STUDY N/A 06/27/2016   Procedure: GIVENS CAPSULE STUDY;  Surgeon: Daneil Dolin, MD;  Location: [facility];  Service: Endoscopy;  Laterality: N/A;  0700    Current Outpatient Prescriptions  Medication Sig Dispense Refill  . aspirin EC 81 MG tablet Take 81 mg by mouth every evening.    . cholecalciferol (VITAMIN D) 1000 units tablet Take 1,000 Units by mouth daily.    . hydrochlorothiazide (HYDRODIURIL) 25 MG tablet Take 12.5 mg by mouth at bedtime.     . Magnesium 250 MG TABS Take 1 tablet by mouth daily.    . Multiple Vitamin (MULTIVITAMIN) tablet Take 1 tablet by mouth daily.    . NON FORMULARY Take 1 tablet by mouth 2 (two) times daily. OTC iron (pt unsure of dose)    . Omega-3 Fatty Acids (FISH OIL PO) Take 1 capsule by mouth every evening.    . simvastatin (ZOCOR) 10 MG tablet Take 10 mg by  mouth at bedtime.     No current facility-administered medications for this visit.     Allergies as of 09/10/2016  . (No Known Allergies)    Family History  Problem Relation Age of Onset  . Colon cancer Mother     early 50s    Social History   Social History  . Marital status: Married    Spouse name: N/A  . Number of children: N/A    . Years of education: N/A   Social History Main Topics  . Smoking status: Never Smoker  . Smokeless tobacco: Never Used     Comment: Never smoked  . Alcohol use No  . Drug use: No  . Sexual activity: Not Asked   Other Topics Concern  . None   Social History Narrative  . None    Review of Systems: General: Negative for anorexia, weight loss, fever, chills, fatigue, weakness. ENT: Negative for hoarseness, difficulty swallowing. CV: Negative for chest pain, angina, palpitations, peripheral edema.  Respiratory: Negative for dyspnea at rest, cough, sputum, wheezing.  GI: See history of present illness. Derm: Negative for rash or itching.  Endo: Negative for unusual weight change.  Heme: Negative for bruising or bleeding. Allergy: Negative for rash or hives.   Physical Exam: BP 137/87   Pulse 83   Temp 97.9 F (36.6 C) (Oral)   Ht 5\' 6"  (1.676 m)   Wt 163 lb 9.6 oz (74.2 kg)   BMI 26.41 kg/m  General:   Alert and oriented. Pleasant and cooperative. Well-nourished and well-developed.  Eyes:  Without icterus, sclera clear and conjunctiva pink.  Ears:  Normal auditory acuity. Cardiovascular:  S1, S2 present without murmurs appreciated. Extremities without clubbing or edema. Respiratory:  Clear to auscultation bilaterally. No wheezes, rales, or rhonchi. No distress.  Gastrointestinal:  +BS, soft, non-tender and non-distended. No HSM noted. No guarding or rebound. No masses appreciated.  Rectal:  Deferred  Musculoskalatal:  Symmetrical without gross deformities. Neurologic:  Alert and oriented x4;  grossly normal neurologically. Psych:  Alert and cooperative. Normal mood and affect. Heme/Lymph/Immune: No excessive bruising noted.    09/10/2016 3:40 PM   Disclaimer: This note was dictated with voice recognition software. Similar sounding words can inadvertently be transcribed and may not be corrected upon review.

## 2016-10-01 NOTE — Discharge Instructions (Addendum)
°Colonoscopy °Discharge Instructions ° °Read the instructions outlined below and refer to this sheet in the next few weeks. These discharge instructions provide you with general information on caring for yourself after you leave the hospital. Your doctor may also give you specific instructions. While your treatment has been planned according to the most current medical practices available, unavoidable complications occasionally occur. If you have any problems or questions after discharge, call Dr. Rourk at [phone]. °ACTIVITY °· You may resume your regular activity, but move at a slower pace for the next 24 hours.  °· Take frequent rest periods for the next 24 hours.  °· Walking will help get rid of the air and reduce the bloated feeling in your belly (abdomen).  °· No driving for 24 hours (because of the medicine (anesthesia) used during the test).   °· Do not sign any important legal documents or operate any machinery for 24 hours (because of the anesthesia used during the test).  °NUTRITION °· Drink plenty of fluids.  °· You may resume your normal diet as instructed by your doctor.  °· Begin with a light meal and progress to your normal diet. Heavy or fried foods are harder to digest and may make you feel sick to your stomach (nauseated).  °· Avoid alcoholic beverages for 24 hours or as instructed.  °MEDICATIONS °· You may resume your normal medications unless your doctor tells you otherwise.  °WHAT YOU CAN EXPECT TODAY °· Some feelings of bloating in the abdomen.  °· Passage of more gas than usual.  °· Spotting of blood in your stool or on the toilet paper.  °IF YOU HAD POLYPS REMOVED DURING THE COLONOSCOPY: °· No aspirin products for 7 days or as instructed.  °· No alcohol for 7 days or as instructed.  °· Eat a soft diet for the next 24 hours.  °FINDING OUT THE RESULTS OF YOUR TEST °Not all test results are available during your visit. If your test results are not back during the visit, make an appointment  with your caregiver to find out the results. Do not assume everything is normal if you have not heard from your caregiver or the medical facility. It is important for you to follow up on all of your test results.  °SEEK IMMEDIATE MEDICAL ATTENTION IF: °· You have more than a spotting of blood in your stool.  °· Your belly is swollen (abdominal distention).  °· You are nauseated or vomiting.  °· You have a temperature over 101.  °· You have abdominal pain or discomfort that is severe or gets worse throughout the day.  ° ° °Diverticulosis and colon polyp information provided ° °Further recommendations to follow pending review of pathology report ° ° °Diverticulosis °Diverticulosis is the condition that develops when small pouches (diverticula) form in the wall of your colon. Your colon, or large intestine, is where water is absorbed and stool is formed. The pouches form when the inside layer of your colon pushes through weak spots in the outer layers of your colon. °CAUSES  °No one knows exactly what causes diverticulosis. °RISK FACTORS °· Being older than 50. Your risk for this condition increases with age. Diverticulosis is rare in people younger than 40 years. By age 80, almost everyone has it. °· Eating a low-fiber diet. °· Being frequently constipated. °· Being overweight. °· Not getting enough exercise. °· Smoking. °· Taking over-the-counter pain medicines, like aspirin and ibuprofen. °SYMPTOMS  °Most people with diverticulosis do not have symptoms. °DIAGNOSIS  °  Because diverticulosis often has no symptoms, health care providers often discover the condition during an exam for other colon problems. In many cases, a health care provider will diagnose diverticulosis while using a flexible scope to examine the colon (colonoscopy). °TREATMENT  °If you have never developed an infection related to diverticulosis, you may not need treatment. If you have had an infection before, treatment may include: °· Eating more  fruits, vegetables, and grains. °· Taking a fiber supplement. °· Taking a live bacteria supplement (probiotic). °· Taking medicine to relax your colon. °HOME CARE INSTRUCTIONS  °· Drink at least 6-8 glasses of water each day to prevent constipation. °· Try not to strain when you have a bowel movement. °· Keep all follow-up appointments. °If you have had an infection before:  °· Increase the fiber in your diet as directed by your health care provider or dietitian. °· Take a dietary fiber supplement if your health care provider approves. °· Only take medicines as directed by your health care provider. °SEEK MEDICAL CARE IF:  °· You have abdominal pain. °· You have bloating. °· You have cramps. °· You have not gone to the bathroom in 3 days. °SEEK IMMEDIATE MEDICAL CARE IF:  °· Your pain gets worse. °· Your bloating becomes very bad. °· You have a fever or chills, and your symptoms suddenly get worse. °· You begin vomiting. °· You have bowel movements that are bloody or black. °MAKE SURE YOU: °· Understand these instructions. °· Will watch your condition. °· Will get help right away if you are not doing well or get worse. °  °This information is not intended to replace advice given to you by your health care provider. Make sure you discuss any questions you have with your health care provider. °  °Document Released: 08/14/2004 Document Revised: 11/22/2013 Document Reviewed: 10/12/2013 °Elsevier Interactive Patient Education ©2016 [employer] °Colon Polyps °Polyps are lumps of extra tissue growing inside the body. Polyps can grow in the large intestine (colon). Most colon polyps are noncancerous (benign). However, some colon polyps can become cancerous over time. Polyps that are larger than a pea may be harmful. To be safe, caregivers remove and test all polyps. °CAUSES  °Polyps form when mutations in the genes cause your cells to grow and divide even though no more tissue is needed. °RISK FACTORS °There are a number  of risk factors that can increase your chances of getting colon polyps. They include: °· Being older than 50 years. °· Family history of colon polyps or colon cancer. °· Long-term colon diseases, such as colitis or Crohn disease. °· Being overweight. °· Smoking. °· Being inactive. °· Drinking too much alcohol. °SYMPTOMS  °Most small polyps do not cause symptoms. If symptoms are present, they may include: °· Blood in the stool. The stool may look dark red or black. °· Constipation or diarrhea that lasts longer than 1 week. °DIAGNOSIS °People often do not know they have polyps until their caregiver finds them during a regular checkup. Your caregiver can use 4 tests to check for polyps: °· Digital rectal exam. The caregiver wears gloves and feels inside the rectum. This test would find polyps only in the rectum. °· Barium enema. The caregiver puts a liquid called barium into your rectum before taking X-rays of your colon. Barium makes your colon look white. Polyps are dark, so they are easy to see in the X-ray pictures. °· Sigmoidoscopy. A thin, flexible tube (sigmoidoscope) is placed into your rectum. The sigmoidoscope has a   light and tiny camera in it. The caregiver uses the sigmoidoscope to look at the last third of your colon. °· Colonoscopy. This test is like sigmoidoscopy, but the caregiver looks at the entire colon. This is the most common method for finding and removing polyps. °TREATMENT  °Any polyps will be removed during a sigmoidoscopy or colonoscopy. The polyps are then tested for cancer. °PREVENTION  °To help lower your risk of getting more colon polyps: °· Eat plenty of fruits and vegetables. Avoid eating fatty foods. °· Do not smoke. °· Avoid drinking alcohol. °· Exercise every day. °· Lose weight if recommended by your caregiver. °· Eat plenty of calcium and folate. Foods that are rich in calcium include milk, cheese, and broccoli. Foods that are rich in folate include chickpeas, kidney beans, and  spinach. °HOME CARE INSTRUCTIONS °Keep all follow-up appointments as directed by your caregiver. You may need periodic exams to check for polyps. °SEEK MEDICAL CARE IF: °You notice bleeding during a bowel movement. °  °This information is not intended to replace advice given to you by your health care provider. Make sure you discuss any questions you have with your health care provider. °  °Document Released: 08/13/2004 Document Revised: 12/08/2014 Document Reviewed: 01/27/2012 °Elsevier Interactive Patient Education ©2016 Elsevier Inc. ° °

## 2016-10-01 NOTE — Interval H&P Note (Signed)
History and Physical Interval Note:  10/01/2016 7:34 AM  Fernando Fisher  has presented today for surgery, with the diagnosis of history of polyps  The various methods of treatment have been discussed with the patient and family. After consideration of risks, benefits and other options for treatment, the patient has consented to  Procedure(s) with comments: COLONOSCOPY (N/A) - 730 as a surgical intervention .  The patient's history has been reviewed, patient examined, no change in status, stable for surgery.  I have reviewed the patient's chart and labs.  Questions were answered to the patient's satisfaction.     Fernando Fisher  No change. Early surveillance colonoscopy per plan.  The risks, benefits, limitations, alternatives and imponderables have been reviewed with the patient. Questions have been answered. All parties are agreeable.

## 2016-10-01 NOTE — Op Note (Signed)
[facility] Patient Name: Fernando Fisher Procedure Date: 10/01/2016 7:34 AM MRN: [id number] Date of Birth: 08-Sep-1956 Attending MD: Norvel Richards , MD CSN: [id number] Age: 60 Admit Type: Outpatient Procedure:                Colonoscopy with snare polypectomy Indications:              Surveillance: Piecemeal removal of large sessile                            adenoma last colonoscopy (< 3 yrs) Providers:                Norvel Richards, MD, Lurline Del, RN, Purcell Nails.                            [place], [employer] Referring MD:              Medicines:                Midazolam 5 mg IV, Meperidine 75 mg IV, Ondansetron                            4 mg IV Complications:            No immediate complications. Estimated Blood Loss:     Estimated blood loss: none. Procedure:                Pre-Anesthesia Assessment:                           - Prior to the procedure, a History and Physical                            was performed, and patient medications and                            allergies were reviewed. The patient's tolerance of                            previous anesthesia was also reviewed. The risks                            and benefits of the procedure and the sedation                            options and risks were discussed with the patient.                            All questions were answered, and informed consent                            was obtained. Prior Anticoagulants: The patient has                            taken no previous anticoagulant or antiplatelet  agents. ASA Grade Assessment: II - A patient with                            mild systemic disease. After reviewing the risks                            and benefits, the patient was deemed in                            satisfactory condition to undergo the procedure.                           After obtaining informed consent, the colonoscope                            was  passed under direct vision. Throughout the                            procedure, the patient's blood pressure, pulse, and                            oxygen saturations were monitored continuously. The                            [id number]-[id number]) scope was introduced through                            the anus and advanced to the the cecum, identified                            by appendiceal orifice and ileocecal valve. The                            colonoscopy was performed without difficulty. The                            patient tolerated the procedure well. The entire                            colon was well visualized. The ileocecal valve,                            appendiceal orifice, and rectum were photographed.                            The quality of the bowel preparation was adequate. Scope In: 7:44:18 AM Scope Out: 8:03:12 AM Scope Withdrawal Time: 0 hours 13 minutes 9 seconds  Total Procedure Duration: 0 hours 18 minutes 54 seconds  Findings:      The perianal and digital rectal examinations were normal.      A 4 mm polyp was found in the cecum. The polyp was sessile. The polyp       was removed with a cold snare. Resection and retrieval were complete.  Estimated blood loss was minimal.      Scattered small and large-mouthed diverticula were found in the sigmoid       colon. Site of prior large piecemeal polypectomy readily identified with       tattoo. Mucosa at this level appeared entirely normal otherwise without       evidence of residual polyp tissue. Impression:               - One 4 mm polyp in the cecum, removed with a cold                            snare. Resected and retrieved. No residual polyp                            site of prior tattoo                           - Diverticulosis in the sigmoid colon. Moderate Sedation:      Moderate (conscious) sedation was administered by the endoscopy nurse       and supervised by the endoscopist. The  following parameters were       monitored: oxygen saturation, heart rate, blood pressure, respiratory       rate, EKG, adequacy of pulmonary ventilation, and response to care.       Total physician intraservice time was 25 minutes. Recommendation:           - Patient has a contact number available for                            emergencies. The signs and symptoms of potential                            delayed complications were discussed with the                            patient. Return to normal activities tomorrow.                            Written discharge instructions were provided to the                            patient.                           - Resume previous diet.                           - Continue present medications.                           - Repeat colonoscopy date to be determined after                            pending pathology results are reviewed for                            surveillance based on  pathology results.                           - Return to GI office (date not yet determined). Procedure Code(s):        --- Professional ---                           [phone], Colonoscopy, flexible; with removal of                            tumor(s), polyp(s), or other lesion(s) by snare                            technique                           99152, Moderate sedation services provided by the                            same physician or other qualified health care                            professional performing the diagnostic or                            therapeutic service that the sedation supports,                            requiring the presence of an independent trained                            observer to assist in the monitoring of the                            patient's level of consciousness and physiological                            status; initial 15 minutes of intraservice time,                            patient age 47 years or older                            [phone], Moderate sedation services; each additional                            15 minutes intraservice time Diagnosis Code(s):        --- Professional ---                           Z86.010, Personal history of colonic polyps                           D12.0, Benign neoplasm of cecum  K57.30, Diverticulosis of large intestine without                            perforation or abscess without bleeding CPT copyright 2016 [employer]. All rights reserved. The codes documented in this report are preliminary and upon coder review may  be revised to meet current compliance requirements. Cristopher Estimable. Donjuan Robison, MD Norvel Richards, MD 10/01/2016 8:17:12 AM This report has been signed electronically. Number of Addenda: 0

## 2016-10-05 ENCOUNTER — Encounter: Payer: Self-pay | Admitting: Internal Medicine

## 2016-10-06 ENCOUNTER — Encounter (HOSPITAL_COMMUNITY): Payer: Self-pay | Admitting: Internal Medicine

## 2019-09-14 ENCOUNTER — Other Ambulatory Visit: Payer: Self-pay

## 2019-09-14 ENCOUNTER — Encounter: Payer: Self-pay | Admitting: Nurse Practitioner

## 2019-09-14 ENCOUNTER — Ambulatory Visit (INDEPENDENT_AMBULATORY_CARE_PROVIDER_SITE_OTHER): Payer: Self-pay | Admitting: Nurse Practitioner

## 2019-09-14 VITALS — BP 142/76 | HR 79 | Temp 97.1°F | Ht 66.0 in | Wt 165.8 lb

## 2019-09-14 DIAGNOSIS — Z860100 Personal history of colon polyps, unspecified: Secondary | ICD-10-CM

## 2019-09-14 DIAGNOSIS — R1012 Left upper quadrant pain: Secondary | ICD-10-CM

## 2019-09-14 MED ORDER — PEG 3350-KCL-NA BICARB-NACL 420 G PO SOLR: 4000.0000 mL | ORAL | 0 refills | Status: DC

## 2019-09-14 NOTE — Progress Notes (Signed)
Referring Provider: Sharilyn Sites, MD Primary Care Physician:  Celene Squibb, MD Primary GI:  Dr. Gala Romney  Chief Complaint  Patient presents with  . Abdominal Pain    improved now, some soreness on left side. PCP prescribed abx 1 month ago and it helped a lot.    HPI:   Fernando Fisher is a 63 y.o. male who presents for follow-up on abdominal pain.  The patient was last seen in our office 09/10/2016 for anemia and history of colon polyps.  At that time he was having some left-sided abdominal pain with a bowel movement daily to every other day but at that time had not had a bowel movement for 4 days.  Likely constipated.  Prior to that recent colonoscopy with multiple polyps including a 28 mm polyp that was tubular adenoma and recommended 66-month repeat exam.  Iron deficiency anemia status post colonoscopy, endoscopy, Givens capsule with no overt cause of bleeding.  Recommended recheck CBC, iron studies, schedule colonoscopy.  Labs drawn 09/10/2016, normalized ferritin at 33, normalized hemoglobin of 14.0, normal iron and percent saturation.  Colonoscopy was completed which found a single 4 mm polyp in the cecum and diverticulosis in the sigmoid colon.  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  Today he states he's doing ok overall. Had left-sided abdominal pain. His PCP presumed diverticulitis and treated him with antibiotics. His pain has significantly improved. Rare, intermittent discomfort. Denies N/V, hematochezia, melena, fever, chills, unintentional weight loss. Denies URI or flu-like symptoms. Denies loss of sense of taste or smell. Denies chest pain, dyspnea, dizziness, lightheadedness, syncope, near syncope. Denies any other upper or lower GI symptoms.  He did have constipation for a week previously.   Past Medical History:  Diagnosis Date  . GERD (gastroesophageal reflux disease)   . Hypercholesterolemia   . Hypertension     Past  Surgical History:  Procedure Laterality Date  . CHOLECYSTECTOMY    . COLONOSCOPY N/A 04/02/2016   RMR: diverticulosis sigmoid colon, six 25mm polyps removed, 22mm polyp in desc colon removed piecemeal using hot snare. tattooed. TCS 10/2016. tubular adenomas  . COLONOSCOPY N/A 10/01/2016   Procedure: COLONOSCOPY;  Surgeon: Daneil Dolin, MD;  Location: [facility];  Service: Endoscopy;  Laterality: N/A;  730  . ESOPHAGOGASTRODUODENOSCOPY N/A 06/18/2016   Procedure: ESOPHAGOGASTRODUODENOSCOPY (EGD);  Surgeon: Daneil Dolin, MD;  Location: [facility];  Service: Endoscopy;  Laterality: N/A;  745  . FOOT SURGERY    . GIVENS CAPSULE STUDY N/A 06/27/2016   Procedure: GIVENS CAPSULE STUDY;  Surgeon: Daneil Dolin, MD;  Location: [facility];  Service: Endoscopy;  Laterality: N/A;  0700    Current Outpatient Medications  Medication Sig Dispense Refill  . aspirin EC 81 MG tablet Take 81 mg by mouth every evening.    . cholecalciferol (VITAMIN D) 1000 units tablet Take 1,000 Units by mouth daily.    . hydrochlorothiazide (HYDRODIURIL) 25 MG tablet Take 25 mg by mouth daily.     Marland Kitchen ibuprofen (ADVIL,MOTRIN) 200 MG tablet Take 200 mg by mouth every 6 (six) hours as needed (for pain.).    Marland Kitchen levothyroxine (SYNTHROID) 25 MCG tablet Take 1 tablet by mouth daily.    . Magnesium 250 MG TABS Take 250 mg by mouth daily.     . Multiple Vitamin (MULTIVITAMIN) tablet Take 1 tablet by mouth daily.    . Omega-3 Fatty Acids (FISH OIL PO) Take  2 capsules by mouth every evening.     . simvastatin (ZOCOR) 10 MG tablet Take 10 mg by mouth at bedtime.    Marland Kitchen VITAMIN E PO Take by mouth daily.     No current facility-administered medications for this visit.     Allergies as of 09/14/2019 - Review Complete 09/14/2019  Allergen Reaction Noted  . Latex Rash 09/26/2016    Family History  Problem Relation Age of Onset  . Colon cancer Mother        early 78s  . Colon cancer Maternal Aunt     Social History    Socioeconomic History  . Marital status: Married    Spouse name: Not on file  . Number of children: Not on file  . Years of education: Not on file  . Highest education level: Not on file  Occupational History  . Not on file  Social Needs  . Financial resource strain: Not on file  . Food insecurity    Worry: Not on file    Inability: Not on file  . Transportation needs    Medical: Not on file    Non-medical: Not on file  Tobacco Use  . Smoking status: Never Smoker  . Smokeless tobacco: Never Used  . Tobacco comment: Never smoked  Substance and Sexual Activity  . Alcohol use: No    Alcohol/week: 0.0 standard drinks  . Drug use: No  . Sexual activity: Not on file  Lifestyle  . Physical activity    Days per week: Not on file    Minutes per session: Not on file  . Stress: Not on file  Relationships  . Social Herbalist on phone: Not on file    Gets together: Not on file    Attends religious service: Not on file    Active member of club or organization: Not on file    Attends meetings of clubs or organizations: Not on file    Relationship status: Not on file  Other Topics Concern  . Not on file  Social History Narrative  . Not on file    Review of Systems: General: Negative for anorexia, weight loss, fever, chills, fatigue, weakness. ENT: Negative for hoarseness, difficulty swallowing. CV: Negative for chest pain, angina, palpitations, peripheral edema.  Respiratory: Negative for dyspnea at rest, cough, sputum, wheezing.  GI: See history of present illness. MS: Negative for joint pain, low back pain.  Derm: Negative for rash or itching.  Endo: Negative for unusual weight change.  Heme: Negative for bruising or bleeding. Allergy: Negative for rash or hives.   Physical Exam: BP (!) 142/76   Pulse 79   Temp (!) 97.1 F (36.2 C) (Oral)   Ht 5\' 6"  (1.676 m)   Wt 165 lb 12.8 oz (75.2 kg)   BMI 26.76 kg/m  General:   Alert and oriented. Pleasant and  cooperative. Well-nourished and well-developed.  Eyes:  Without icterus, sclera clear and conjunctiva pink.  Ears:  Normal auditory acuity. Cardiovascular:  S1, S2 present without murmurs appreciated. Extremities without clubbing or edema. Respiratory:  Clear to auscultation bilaterally. No wheezes, rales, or rhonchi. No distress.  Gastrointestinal:  +BS, soft, non-tender and non-distended. No HSM noted. No guarding or rebound. No masses appreciated.  Rectal:  Deferred  Musculoskalatal:  Symmetrical without gross deformities. Neurologic:  Alert and oriented x4;  grossly normal neurologically. Psych:  Alert and cooperative. Normal mood and affect. Heme/Lymph/Immune: No excessive bruising noted.    09/14/2019 3:41  PM   Disclaimer: This note was dictated with voice recognition software. Similar sounding words can inadvertently be transcribed and may not be corrected upon review.

## 2019-09-14 NOTE — Patient Instructions (Signed)
Your health issues we discussed today were:   Abdominal pain with known diverticular disease: 1. I am glad your abdominal pain is better 2. Call us if you have any worsening or severe abdominal pain 3. We will proceed with a colonoscopy as per below 4. I am printing out information about diverticula for you 5. You should consume a high-fiber diet to help prevent colon cancer  Need for colonoscopy due to family history of colon cancer and personal history of colon polyps: 1. We will schedule your colonoscopy for you 2. Further recommendations will follow your colonoscopy  Overall I recommend:  1. Continue your other current medications 2. Return for follow-up in 4 months 3. Call us if you have any questions or concerns   Because of recent events of COVID-19 ("Coronavirus"), follow CDC recommendations:  1. Wash your hand frequently 2. Avoid touching your face 3. Stay away from people who are sick 4. If you have symptoms such as fever, cough, shortness of breath then call your healthcare provider for further guidance 5. If you are sick, STAY AT HOME unless otherwise directed by your healthcare provider. 6. Follow directions from state and national officials regarding staying safe   At [facility] we value your feedback. You may receive a survey about your visit today. Please share your experience as we strive to create trusting relationships with our patients to provide genuine, compassionate, quality care.  We appreciate your understanding and patience as we review any laboratory studies, imaging, and other diagnostic tests that are ordered as we care for you. Our office policy is 5 business days for review of these results, and any emergent or urgent results are addressed in a timely manner for your best interest. If you do not hear from our office in 1 week, please contact us.   We also encourage the use of MyChart, which contains your medical information for your  review as well. If you are not enrolled in this feature, an access code is on this after visit summary for your convenience. Thank you for allowing [place] to be involved in your care.  It was great to see you today!  I hope you have a great Fall!!

## 2019-09-14 NOTE — Assessment & Plan Note (Signed)
History of frequent and multiple colon polyps.  Last colonoscopy 3 years ago with a single tubular adenoma with recommended 3-year repeat.  He is currently due.  He was having some left-sided left lower quadrant abdominal pain which has improved significantly since his PCP sent in Cipro and Flagyl for presumed diverticulitis.  We will proceed with colonoscopy at this time.  Of note he is high risk due to a primary relative (mother) with history of colon cancer in her 3s.  Proceed with TCS with Dr. Gala Romney in near future: the risks, benefits, and alternatives have been discussed with the patient in detail. The patient states understanding and desires to proceed.  The patient is not on any anticoagulants, anxiolytics, chronic pain medications, antidepressants, antidiabetics, or iron supplements.  Conscious sedation should be adequate for his procedure as it was for his last.

## 2019-09-14 NOTE — Assessment & Plan Note (Signed)
The patient was having left-sided/left lower quadrant abdominal pain.  His primary care treated him for presumed diverticulitis with Cipro and Flagyl.  Recommended if he has recurrent pain to call our office.  At that point we would likely do a CT of his abdomen and pelvis to document diverticulitis.  Notify us of any worsening pain.  Follow-up in 4 months.

## 2019-09-15 ENCOUNTER — Encounter: Payer: Self-pay | Admitting: Internal Medicine

## 2019-09-15 ENCOUNTER — Telehealth: Payer: Self-pay

## 2019-09-15 NOTE — Telephone Encounter (Signed)
[employer] on file for pt's insurance. Called [employer] to check PA for TCS. Spoke to Mickel Baas, his ID# expired in 2018. Tried to call pt, no answer, LMOAM for return call.

## 2019-09-16 ENCOUNTER — Telehealth: Payer: Self-pay | Admitting: Internal Medicine

## 2019-09-16 MED ORDER — CLENPIQ 10-3.5-12 MG-GM -GM/160ML PO SOLN: 1.0000 | Freq: Once | ORAL | 0 refills | Status: AC

## 2019-09-16 NOTE — Telephone Encounter (Signed)
Clenpiq replaced Prepopik. Rx for Clenpiq sent to pharmacy. Mailed new instructions and Clenpiq coupon to pt. Called and informed him.

## 2019-09-16 NOTE — Telephone Encounter (Signed)
PATIENT WANTED TO KNOW IF HE CAN HAVE THE PREPOPIK PREP FOR HIS TCS, THAT IS WHAT HE HAD LAST TIME

## 2019-09-19 ENCOUNTER — Telehealth: Payer: Self-pay | Admitting: Internal Medicine

## 2019-09-19 NOTE — Telephone Encounter (Signed)
Called pt, Clenpiq rx was sent last week.

## 2019-09-19 NOTE — Telephone Encounter (Signed)
Patient wife called and said that the patient can not do the gallon prep because it makes him throw up, can he have the small bottles he used at this last procedure

## 2019-09-22 ENCOUNTER — Telehealth: Payer: Self-pay | Admitting: *Deleted

## 2019-09-22 NOTE — Telephone Encounter (Signed)
Called [employer]. Spoke with Robin A. No precert is required for TCS. Ref# [id number]

## 2019-10-04 ENCOUNTER — Telehealth: Payer: Self-pay | Admitting: Internal Medicine

## 2019-10-04 NOTE — Telephone Encounter (Signed)
Called wife, pt is now taking Flintstones vitamins with Iron daily. Does he need to hold vitamin prior to TCS?

## 2019-10-04 NOTE — Telephone Encounter (Signed)
Pt's wife called to say patient is scheduled procedure with RMR on 12/2 and she was calling to let the nurse know the medications he takes. [phone]

## 2019-10-07 NOTE — Telephone Encounter (Signed)
Yes, hold for 1 week if it has iron in it.

## 2019-10-10 NOTE — Telephone Encounter (Signed)
Called and informed pt's wife he will need to hold Flintstone vitamin with Iron for 1 week before procedure. New instructions mailed.

## 2019-10-31 ENCOUNTER — Other Ambulatory Visit (HOSPITAL_COMMUNITY)
Admission: RE | Admit: 2019-10-31 | Discharge: 2019-10-31 | Disposition: A | Discharge status: Home / Self Care | Payer: PRIVATE HEALTH INSURANCE | Source: Ambulatory Visit | Attending: Internal Medicine | Admitting: Internal Medicine

## 2019-10-31 ENCOUNTER — Other Ambulatory Visit: Payer: Self-pay

## 2019-10-31 DIAGNOSIS — Z01812 Encounter for preprocedural laboratory examination: Secondary | ICD-10-CM | POA: Diagnosis not present

## 2019-10-31 DIAGNOSIS — Z20828 Contact with and (suspected) exposure to other viral communicable diseases: Secondary | ICD-10-CM | POA: Insufficient documentation

## 2019-10-31 LAB — SARS CORONAVIRUS 2 (TAT 6-24 HRS): SARS Coronavirus 2: NEGATIVE

## 2019-11-02 ENCOUNTER — Ambulatory Visit (HOSPITAL_COMMUNITY)
Admission: RE | Admit: 2019-11-02 | Discharge: 2019-11-02 | Disposition: A | Discharge status: Home / Self Care | Payer: PRIVATE HEALTH INSURANCE | Attending: Internal Medicine | Admitting: Internal Medicine

## 2019-11-02 ENCOUNTER — Encounter (HOSPITAL_COMMUNITY): Payer: Self-pay | Admitting: *Deleted

## 2019-11-02 ENCOUNTER — Other Ambulatory Visit: Payer: Self-pay

## 2019-11-02 ENCOUNTER — Encounter (HOSPITAL_COMMUNITY)
Admission: RE | Disposition: A | Discharge status: Home / Self Care | Payer: Self-pay | Source: Home / Self Care | Attending: Internal Medicine

## 2019-11-02 DIAGNOSIS — K5730 Diverticulosis of large intestine without perforation or abscess without bleeding: Secondary | ICD-10-CM | POA: Diagnosis not present

## 2019-11-02 DIAGNOSIS — Z79899 Other long term (current) drug therapy: Secondary | ICD-10-CM | POA: Diagnosis not present

## 2019-11-02 DIAGNOSIS — Z800 Family history of malignant neoplasm of digestive organs: Secondary | ICD-10-CM | POA: Insufficient documentation

## 2019-11-02 DIAGNOSIS — K621 Rectal polyp: Secondary | ICD-10-CM | POA: Diagnosis not present

## 2019-11-02 DIAGNOSIS — Z79890 Hormone replacement therapy (postmenopausal): Secondary | ICD-10-CM | POA: Diagnosis not present

## 2019-11-02 DIAGNOSIS — K635 Polyp of colon: Secondary | ICD-10-CM | POA: Diagnosis not present

## 2019-11-02 DIAGNOSIS — R1012 Left upper quadrant pain: Secondary | ICD-10-CM

## 2019-11-02 DIAGNOSIS — D120 Benign neoplasm of cecum: Secondary | ICD-10-CM | POA: Insufficient documentation

## 2019-11-02 DIAGNOSIS — E7800 Pure hypercholesterolemia, unspecified: Secondary | ICD-10-CM | POA: Diagnosis not present

## 2019-11-02 DIAGNOSIS — Z09 Encounter for follow-up examination after completed treatment for conditions other than malignant neoplasm: Secondary | ICD-10-CM | POA: Diagnosis present

## 2019-11-02 DIAGNOSIS — I10 Essential (primary) hypertension: Secondary | ICD-10-CM | POA: Insufficient documentation

## 2019-11-02 DIAGNOSIS — Z7982 Long term (current) use of aspirin: Secondary | ICD-10-CM | POA: Diagnosis not present

## 2019-11-02 DIAGNOSIS — Z86010 Personal history of colonic polyps: Secondary | ICD-10-CM | POA: Diagnosis not present

## 2019-11-02 HISTORY — PX: POLYPECTOMY: SHX5525

## 2019-11-02 HISTORY — PX: COLONOSCOPY: SHX5424

## 2019-11-02 SURGERY — COLONOSCOPY
Anesthesia: Moderate Sedation

## 2019-11-02 MED ORDER — MEPERIDINE HCL 100 MG/ML IJ SOLN
INTRAMUSCULAR | Status: DC | PRN
  Administered 2019-11-02: 15 mg via INTRAVENOUS
  Administered 2019-11-02: 25 mg via INTRAVENOUS

## 2019-11-02 MED ORDER — SODIUM CHLORIDE 0.9 % IV SOLN
INTRAVENOUS | Status: DC
  Administered 2019-11-02: 08:00:00 via INTRAVENOUS

## 2019-11-02 MED ORDER — ONDANSETRON HCL 4 MG/2ML IJ SOLN
INTRAMUSCULAR | Status: DC | PRN
  Administered 2019-11-02: 4 mg via INTRAVENOUS

## 2019-11-02 MED ORDER — MIDAZOLAM HCL 5 MG/5ML IJ SOLN
INTRAMUSCULAR | Status: DC | PRN
  Administered 2019-11-02 (×3): 1 mg via INTRAVENOUS
  Administered 2019-11-02: 2 mg via INTRAVENOUS
  Administered 2019-11-02: 1 mg via INTRAVENOUS

## 2019-11-02 MED ORDER — ONDANSETRON HCL 4 MG/2ML IJ SOLN
INTRAMUSCULAR | Status: AC
  Filled 2019-11-02: qty 2

## 2019-11-02 MED ORDER — MIDAZOLAM HCL 5 MG/5ML IJ SOLN
INTRAMUSCULAR | Status: AC
  Filled 2019-11-02: qty 10

## 2019-11-02 MED ORDER — MEPERIDINE HCL 50 MG/ML IJ SOLN
INTRAMUSCULAR | Status: AC
  Filled 2019-11-02: qty 1

## 2019-11-02 NOTE — Discharge Instructions (Signed)
Colonoscopy Discharge Instructions  Read the instructions outlined below and refer to this sheet in the next few weeks. These discharge instructions provide you with general information on caring for yourself after you leave the hospital. Your doctor may also give you specific instructions. While your treatment has been planned according to the most current medical practices available, unavoidable complications occasionally occur. If you have any problems or questions after discharge, call Dr. Gala Romney at [phone]. ACTIVITY  You may resume your regular activity, but move at a slower pace for the next 24 hours.   Take frequent rest periods for the next 24 hours.   Walking will help get rid of the air and reduce the bloated feeling in your belly (abdomen).   No driving for 24 hours (because of the medicine (anesthesia) used during the test).    Do not sign any important legal documents or operate any machinery for 24 hours (because of the anesthesia used during the test).  NUTRITION  Drink plenty of fluids.   You may resume your normal diet as instructed by your doctor.   Begin with a light meal and progress to your normal diet. Heavy or fried foods are harder to digest and may make you feel sick to your stomach (nauseated).   Avoid alcoholic beverages for 24 hours or as instructed.  MEDICATIONS  You may resume your normal medications unless your doctor tells you otherwise.  WHAT YOU CAN EXPECT TODAY  Some feelings of bloating in the abdomen.   Passage of more gas than usual.   Spotting of blood in your stool or on the toilet paper.  IF YOU HAD POLYPS REMOVED DURING THE COLONOSCOPY:  No aspirin products for 7 days or as instructed.   No alcohol for 7 days or as instructed.   Eat a soft diet for the next 24 hours.  FINDING OUT THE RESULTS OF YOUR TEST Not all test results are available during your visit. If your test results are not back during the visit, make an appointment  with your caregiver to find out the results. Do not assume everything is normal if you have not heard from your caregiver or the medical facility. It is important for you to follow up on all of your test results.  SEEK IMMEDIATE MEDICAL ATTENTION IF:  You have more than a spotting of blood in your stool.   Your belly is swollen (abdominal distention).   You are nauseated or vomiting.   You have a temperature over 101.   You have abdominal pain or discomfort that is severe or gets worse throughout the day.    Diverticulosis and colon polyp information provided  Further recommendations to follow pending review of pathology report  At patient request, I called Talus Scarfo at [phone] -got voicemail box "not set up yet"   Diverticulosis  Diverticulosis is a condition that develops when small pouches (diverticula) form in the wall of the large intestine (colon). The colon is where water is absorbed and stool is formed. The pouches form when the inside layer of the colon pushes through weak spots in the outer layers of the colon. You may have a few pouches or many of them. What are the causes? The cause of this condition is not known. What increases the risk? The following factors may make you more likely to develop this condition:  Being older than age 33. Your risk for this condition increases with age. Diverticulosis is rare among people younger than age 1. By  age 21, many people have it.  Eating a low-fiber diet.  Having frequent constipation.  Being overweight.  Not getting enough exercise.  Smoking.  Taking over-the-counter pain medicines, like aspirin and ibuprofen.  Having a family history of diverticulosis. What are the signs or symptoms? In most people, there are no symptoms of this condition. If you do have symptoms, they may include:  Bloating.  Cramps in the abdomen.  Constipation or diarrhea.  Pain in the lower left side of the abdomen. How is  this diagnosed? This condition is most often diagnosed during an exam for other colon problems. Because diverticulosis usually has no symptoms, it often cannot be diagnosed independently. This condition may be diagnosed by:  Using a flexible scope to examine the colon (colonoscopy).  Taking an X-ray of the colon after dye has been put into the colon (barium enema).  Doing a CT scan. How is this treated? You may not need treatment for this condition if you have never developed an infection related to diverticulosis. If you have had an infection before, treatment may include:  Eating a high-fiber diet. This may include eating more fruits, vegetables, and grains.  Taking a fiber supplement.  Taking a live bacteria supplement (probiotic).  Taking medicine to relax your colon.  Taking antibiotic medicines. Follow these instructions at home:  Drink 6-8 glasses of water or more each day to prevent constipation.  Try not to strain when you have a bowel movement.  If you have had an infection before: ? Eat more fiber as directed by your health care provider or your diet and nutrition specialist (dietitian). ? Take a fiber supplement or probiotic, if your health care provider approves.  Take over-the-counter and prescription medicines only as told by your health care provider.  If you were prescribed an antibiotic, take it as told by your health care provider. Do not stop taking the antibiotic even if you start to feel better.  Keep all follow-up visits as told by your health care provider. This is important. Contact a health care provider if:  You have pain in your abdomen.  You have bloating.  You have cramps.  You have not had a bowel movement in 3 days. Get help right away if:  Your pain gets worse.  Your bloating becomes very bad.  You have a fever or chills, and your symptoms suddenly get worse.  You vomit.  You have bowel movements that are bloody or black.  You  have bleeding from your rectum. Summary  Diverticulosis is a condition that develops when small pouches (diverticula) form in the wall of the large intestine (colon).  You may have a few pouches or many of them.  This condition is most often diagnosed during an exam for other colon problems.  If you have had an infection related to diverticulosis, treatment may include increasing the fiber in your diet, taking supplements, or taking medicines. This information is not intended to replace advice given to you by your health care provider. Make sure you discuss any questions you have with your health care provider. Document Released: 08/14/2004 Document Revised: 10/30/2017 Document Reviewed: 10/06/2016 Elsevier Patient Education  Plain.  Colon Polyps  Polyps are tissue growths inside the body. Polyps can grow in many places, including the large intestine (colon). A polyp may be a round bump or a mushroom-shaped growth. You could have one polyp or several. Most colon polyps are noncancerous (benign). However, some colon polyps can become cancerous over  time. Finding and removing the polyps early can help prevent this. What are the causes? The exact cause of colon polyps is not known. What increases the risk? You are more likely to develop this condition if you:  Have a family history of colon cancer or colon polyps.  Are older than 91 or older than 45 if you are African American.  Have inflammatory bowel disease, such as ulcerative colitis or Crohn's disease.  Have certain hereditary conditions, such as: ? Familial adenomatous polyposis. ? Lynch syndrome. ? Turcot syndrome. ? Peutz-Jeghers syndrome.  Are overweight.  Smoke cigarettes.  Do not get enough exercise.  Drink too much alcohol.  Eat a diet that is high in fat and red meat and low in fiber.  Had childhood cancer that was treated with abdominal radiation. What are the signs or symptoms? Most polyps do  not cause symptoms. If you have symptoms, they may include:  Blood coming from your rectum when having a bowel movement.  Blood in your stool. The stool may look dark red or black.  Abdominal pain.  A change in bowel habits, such as constipation or diarrhea. How is this diagnosed? This condition is diagnosed with a colonoscopy. This is a procedure in which a lighted, flexible scope is inserted into the anus and then passed into the colon to examine the area. Polyps are sometimes found when a colonoscopy is done as part of routine cancer screening tests. How is this treated? Treatment for this condition involves removing any polyps that are found. Most polyps can be removed during a colonoscopy. Those polyps will then be tested for cancer. Additional treatment may be needed depending on the results of testing. Follow these instructions at home: Lifestyle  Maintain a healthy weight, or lose weight if recommended by your health care provider.  Exercise every day or as told by your health care provider.  Do not use any products that contain nicotine or tobacco, such as cigarettes and e-cigarettes. If you need help quitting, ask your health care provider.  If you drink alcohol, limit how much you have: ? 0-1 drink a day for women. ? 0-2 drinks a day for men.  Be aware of how much alcohol is in your drink. In the [place] one drink equals one 12 oz bottle of beer (355 mL), one 5 oz glass of wine (148 mL), or one 1 oz shot of hard liquor (44 mL). Eating and drinking   Eat foods that are high in fiber, such as fruits, vegetables, and whole grains.  Eat foods that are high in calcium and vitamin D, such as milk, cheese, yogurt, eggs, liver, fish, and broccoli.  Limit foods that are high in fat, such as fried foods and desserts.  Limit the amount of red meat and processed meat you eat, such as hot dogs, sausage, bacon, and lunch meats. General instructions  Keep all follow-up visits as  told by your health care provider. This is important. ? This includes having regularly scheduled colonoscopies. ? Talk to your health care provider about when you need a colonoscopy. Contact a health care provider if:  You have new or worsening bleeding during a bowel movement.  You have new or increased blood in your stool.  You have a change in bowel habits.  You lose weight for no known reason. Summary  Polyps are tissue growths inside the body. Polyps can grow in many places, including the colon.  Most colon polyps are noncancerous (benign), but some can  become cancerous over time.  This condition is diagnosed with a colonoscopy.  Treatment for this condition involves removing any polyps that are found. Most polyps can be removed during a colonoscopy. This information is not intended to replace advice given to you by your health care provider. Make sure you discuss any questions you have with your health care provider. Document Released: 08/13/2004 Document Revised: 03/04/2018 Document Reviewed: 03/04/2018 Elsevier Patient Education  2020 [employer].

## 2019-11-02 NOTE — Op Note (Signed)
[facility] Patient Name: Fernando Fisher Procedure Date: 11/02/2019 8:11 AM MRN: [id number] Date of Birth: [birth year] Attending MD: Norvel Richards , MD CSN: [id number] Age: 63 Admit Type: Outpatient Procedure:                Colonoscopy Indications:              High risk colon cancer surveillance: Personal                            history of colonic polyps Providers:                Norvel Richards, MD, Charlsie Quest. Theda Sers RN, RN,                            Aram Candela Referring MD:              Medicines:                Midazolam 6 mg IV, Meperidine 40 mg IV, Ondansetron                            4 mg IV Complications:            No immediate complications. Estimated Blood Loss:     Estimated blood loss was minimal. Procedure:                Pre-Anesthesia Assessment:                           - Prior to the procedure, a History and Physical                            was performed, and patient medications and                            allergies were reviewed. The patient's tolerance of                            previous anesthesia was also reviewed. The risks                            and benefits of the procedure and the sedation                            options and risks were discussed with the patient.                            All questions were answered, and informed consent                            was obtained. Prior Anticoagulants: The patient has                            taken no previous anticoagulant or antiplatelet  agents. ASA Grade Assessment: II - A patient with                            mild systemic disease. After reviewing the risks                            and benefits, the patient was deemed in                            satisfactory condition to undergo the procedure.                           After obtaining informed consent, the colonoscope                            was passed under direct vision.  Throughout the                            procedure, the patient's blood pressure, pulse, and                            oxygen saturations were monitored continuously. The                            [id number]) scope was introduced through                            the anus and advanced to the the cecum, identified                            by appendiceal orifice and ileocecal valve. The                            colonoscopy was performed without difficulty. The                            patient tolerated the procedure well. The quality                            of the bowel preparation was adequate. Scope In: 8:37:51 AM Scope Out: 8:56:05 AM Scope Withdrawal Time: 0 hours 14 minutes 13 seconds  Total Procedure Duration: 0 hours 18 minutes 14 seconds  Findings:      The perianal and digital rectal examinations were normal.      Two sessile polyps were found in the rectum and cecum. The polyps were 5       to 11 mm in size. These polyps were removed with a cold snare. Resection       and retrieval were complete. Estimated blood loss was minimal.      Scattered medium-mouthed diverticula were found in the sigmoid colon.      The exam was otherwise without abnormality on direct and retroflexion       views. Impression:               - Two 5 to 11 mm  polyps in the rectum and in the                            cecum, removed with a cold snare. Resected and                            retrieved.                           - Diverticulosis in the sigmoid colon.                           - The examination was otherwise normal on direct                            and retroflexion views. Moderate Sedation:      Moderate (conscious) sedation was administered by the endoscopy nurse       and supervised by the endoscopist. The following parameters were       monitored: oxygen saturation, heart rate, blood pressure, respiratory       rate, EKG, adequacy of pulmonary ventilation, and  response to care.       Total physician intraservice time was 26 minutes. Recommendation:           - Patient has a contact number available for                            emergencies. The signs and symptoms of potential                            delayed complications were discussed with the                            patient. Return to normal activities tomorrow.                            Written discharge instructions were provided to the                            patient.                           - Advance diet as tolerated. Procedure Code(s):        --- Professional ---                           [id number], Colonoscopy, flexible; with removal of                            tumor(s), polyp(s), or other lesion(s) by snare                            technique                           99153, Moderate sedation; each additional 15  minutes intraservice time                           G0500, Moderate sedation services provided by the                            same physician or other qualified health care                            professional performing a gastrointestinal                            endoscopic service that sedation supports,                            requiring the presence of an independent trained                            observer to assist in the monitoring of the                            patient's level of consciousness and physiological                            status; initial 15 minutes of intra-service time;                            patient age 57 years or older (additional time may                            be reported with [phone], as appropriate) Diagnosis Code(s):        --- Professional ---                           Z86.010, Personal history of colonic polyps                           K62.1, Rectal polyp                           K63.5, Polyp of colon                           K57.30, Diverticulosis of large intestine without                             perforation or abscess without bleeding CPT copyright 2019 American Medical Association. All rights reserved. The codes documented in this report are preliminary and upon coder review may  be revised to meet current compliance requirements. Cristopher Estimable. Norvin Ohlin, MD Norvel Richards, MD 11/02/2019 9:02:30 AM This report has been signed electronically. Number of Addenda: 0

## 2019-11-02 NOTE — H&P (Signed)
@LOGO @   Primary Care Physician:  Celene Squibb, MD Primary Gastroenterologist:  Dr. Gala Romney  Pre-Procedure History & Physical: HPI:  Fernando Fisher is a 63 y.o. male here for surveillance colonoscopy.  History of multiple colonic adenomas removed previously.  Also positive family history of colon cancer.  No GI symptoms currently.  Past Medical History:  Diagnosis Date  . GERD (gastroesophageal reflux disease)   . Hypercholesterolemia   . Hypertension     Past Surgical History:  Procedure Laterality Date  . CHOLECYSTECTOMY    . COLONOSCOPY N/A 04/02/2016   RMR: diverticulosis sigmoid colon, six 11mm polyps removed, 62mm polyp in desc colon removed piecemeal using hot snare. tattooed. TCS 10/2016. tubular adenomas  . COLONOSCOPY N/A 10/01/2016   Procedure: COLONOSCOPY;  Surgeon: Daneil Dolin, MD;  [facility];  Service: Endoscopy;  Laterality: N/A;  730  . ESOPHAGOGASTRODUODENOSCOPY N/A 06/18/2016   Procedure: ESOPHAGOGASTRODUODENOSCOPY (EGD);  Surgeon: Daneil Dolin, MD;  [facility]: [facility];  Service: Endoscopy;  Laterality: N/A;  745  . FOOT SURGERY    . GIVENS CAPSULE STUDY N/A 06/27/2016   Procedure: GIVENS CAPSULE STUDY;  Surgeon: Daneil Dolin, MD;  [facility]: [facility];  Service: Endoscopy;  Laterality: N/A;  0700    Prior to Admission medications   Medication Sig Start Date End Date Taking? Authorizing Provider  aspirin EC 81 MG tablet Take 81 mg by mouth daily.    Yes [provider]  calcium carbonate (OSCAL) 1500 (600 Ca) MG TABS tablet Take 600 mg of elemental calcium by mouth at bedtime.   Yes [provider]  Cholecalciferol (VITAMIN D) 50 MCG (2000 UT) CAPS Take 2,000 Units by mouth at bedtime.    Yes [provider]  hydrochlorothiazide (HYDRODIURIL) 25 MG tablet Take 25 mg by mouth daily.    Yes [provider]  ibuprofen (ADVIL,MOTRIN) 200 MG tablet Take 200 mg by mouth every 6 (six) hours as needed (for pain.).    Yes [provider]  levothyroxine (SYNTHROID) 25 MCG tablet Take 25 mcg by mouth daily.  06/24/19  Yes [provider]  Magnesium 400 MG CAPS Take 400 mg by mouth daily.    Yes [provider]  Multiple Vitamin (MULTIVITAMIN) tablet Take 1 tablet by mouth daily. 50 +   Yes [provider]  Omega-3 Fatty Acids (FISH OIL) 1000 MG CPDR Take 2,000 mcg by mouth every evening.    Yes [provider]  simvastatin (ZOCOR) 20 MG tablet Take 20 mg by mouth at bedtime.    Yes [provider]  vitamin C (ASCORBIC ACID) 500 MG tablet Take 500 mg by mouth daily.   Yes [provider]  Vitamin E 400 units TABS Take 400 Units by mouth at bedtime.    Yes [provider]  zinc gluconate 50 MG tablet Take 50 mg by mouth at bedtime.   Yes [provider]  polyethylene glycol-electrolytes (TRILYTE) 420 g solution Take 4,000 mLs by mouth as directed. 09/14/19   Daneil Dolin, MD    Allergies as of 09/14/2019 - Review Complete 09/14/2019  Allergen Reaction Noted  . Latex Rash 09/26/2016    Family History  Problem Relation Age of Onset  . Colon cancer Mother        early 55s  . Colon cancer Maternal Aunt     Social History   Socioeconomic History  . Marital status: Married    Spouse name: Not  on file  . Number of children: Not on file  . Years of education: Not on file  . Highest education level: Not on file  Occupational History  . Not on file  Social Needs  . Financial resource strain: Not on file  . Food insecurity    Worry: Not on file    Inability: Not on file  . Transportation needs    Medical: Not on file    Non-medical: Not on file  Tobacco Use  . Smoking status: Never Smoker  . Smokeless tobacco: Never Used  . Tobacco comment: Never smoked  Substance and Sexual Activity  . Alcohol use: No    Alcohol/week: 0.0 standard drinks  . Drug use: No  . Sexual activity: Not on file  Lifestyle  . Physical  activity    Days per week: Not on file    Minutes per session: Not on file  . Stress: Not on file  Relationships  . Social Herbalist on phone: Not on file    Gets together: Not on file    Attends religious service: Not on file    Active member of club or organization: Not on file    Attends meetings of clubs or organizations: Not on file    Relationship status: Not on file  . Intimate partner violence    Fear of current or ex partner: Not on file    Emotionally abused: Not on file    Physically abused: Not on file    Forced sexual activity: Not on file  Other Topics Concern  . Not on file  Social History Narrative  . Not on file    Review of Systems: See HPI, otherwise negative ROS  Physical Exam: BP (!) 144/88   Pulse 86   Temp 97.7 F (36.5 C) (Oral)   Resp 14   Ht 5\' 6"  (1.676 m)   Wt 74.4 kg   SpO2 99%   BMI 26.47 kg/m  General:   Alert,  Well-developed, well-nourished, pleasant and cooperative in NAD Neck:  Supple; no masses or thyromegaly. No significant cervical adenopathy. Lungs:  Clear throughout to auscultation.   No wheezes, crackles, or rhonchi. No acute distress. Heart:  Regular rate and rhythm; no murmurs, clicks, rubs,  or gallops. Abdomen: Non-distended, normal bowel sounds.  Soft and nontender without appreciable mass or hepatosplenomegaly.  Impression/Plan: 63 year old gentleman with personal history of colonic polyps and positive family history colon cancer.  Here for surveillance colonoscopy per plan.  The risks, benefits, limitations, alternatives and imponderables have been reviewed with the patient. Questions have been answered. All parties are agreeable.      Notice: This dictation was prepared with Dragon dictation along with smaller phrase technology. Any transcriptional errors that result from this process are unintentional and may not be corrected upon review.

## 2019-11-03 LAB — SURGICAL PATHOLOGY

## 2019-11-04 ENCOUNTER — Encounter (HOSPITAL_COMMUNITY): Payer: Self-pay | Admitting: Internal Medicine

## 2019-11-06 ENCOUNTER — Encounter: Payer: Self-pay | Admitting: Internal Medicine

## 2020-01-17 ENCOUNTER — Encounter: Payer: Self-pay | Admitting: Nurse Practitioner

## 2020-01-17 ENCOUNTER — Other Ambulatory Visit: Payer: Self-pay

## 2020-01-17 ENCOUNTER — Ambulatory Visit (INDEPENDENT_AMBULATORY_CARE_PROVIDER_SITE_OTHER): Payer: PRIVATE HEALTH INSURANCE | Admitting: Nurse Practitioner

## 2020-01-17 VITALS — BP 138/80 | HR 78 | Temp 96.6°F | Ht 66.0 in | Wt 168.2 lb

## 2020-01-17 DIAGNOSIS — R1012 Left upper quadrant pain: Secondary | ICD-10-CM | POA: Diagnosis not present

## 2020-01-17 DIAGNOSIS — Z86010 Personal history of colonic polyps: Secondary | ICD-10-CM | POA: Diagnosis not present

## 2020-01-17 NOTE — Progress Notes (Signed)
CC'ED TO PCP 

## 2020-01-17 NOTE — Assessment & Plan Note (Signed)
Previous colonoscopy with reviewed polyps and high risk CRC patient.  Surgical pathology with tubular adenoma and recommended 3-year repeat exam.  He is on recall for 2023.  Discussed the importance of monitoring for hematochezia and to let us know if he sees any.  Follow-up in 6 months otherwise.

## 2020-01-17 NOTE — Assessment & Plan Note (Signed)
Significant improvement in abdominal pain after colonoscopy when he started probiotic.  Recommend he continue his current medication, let us know if any recurrent symptoms and follow-up in 6 months.

## 2020-01-17 NOTE — Patient Instructions (Signed)
Your health issues we discussed today were:   Abdominal pain: 1. I am glad you are feeling better! 2. Continue to take your probiotics 3. Let us know if your abdominal pain comes back, especially if it lasts more than just briefly or is severe  History of colon polyps: 1. As we discussed, you are at increased risk for colon cancer due to your family history and frequent previous polyps 2. Your last colonoscopy found 2 polyps which were mildly precancerous 3. We will repeat your next colonoscopy 2023 4. If you forget about it, we will schedule letter 5. It is important to monitor for any rectal bleeding and let us know if you see any  Overall I recommend:  1. Continue your other current medications 2. Return for follow-up in 6 months 3. Call us if you have any questions or concerns   ---------------------------------------------------------------  COVID-19 Vaccine Information can be found at: [email or web address] For questions related to vaccine distribution or appointments, please email [email or web address] or call [phone].   ---------------------------------------------------------------   At [facility] we value your feedback. You may receive a survey about your visit today. Please share your experience as we strive to create trusting relationships with our patients to provide genuine, compassionate, quality care.  We appreciate your understanding and patience as we review any laboratory studies, imaging, and other diagnostic tests that are ordered as we care for you. Our office policy is 5 business days for review of these results, and any emergent or urgent results are addressed in a timely manner for your best interest. If you do not hear from our office in 1 week, please contact us.   We also encourage the use of MyChart, which contains your medical information for your review as well. If you are not  enrolled in this feature, an access code is on this after visit summary for your convenience. Thank you for allowing [place] to be involved in your care.  It was great to see you today!  I hope you have a great day!!

## 2020-01-17 NOTE — Progress Notes (Signed)
Referring Provider: Celene Squibb, MD Primary Care Physician:  Celene Squibb, MD Primary GI:  Dr. Gala Romney  Chief Complaint  Patient presents with  . Follow-up    fu abd pain,probiotic has took care of abd pain    HPI:   Fernando Fisher is a 64 y.o. male who presents for follow-up on abdominal pain and history of colon polyps.  The patient was last seen in our office 09/14/2019 for abdominal pain and polyp history.  At a previous visit his abdominal pain was around the time he had not had a bowel movement for 4 days and likely constipation.  Previous multiple colon polyps including a 28 mm polyp that was tubular adenoma and recommended 51-month repeat.  Iron deficiency anemia status post colonoscopy and endoscopy as well is Givens capsule with no overt cause.  Ferritin did normalize in 2017.  Most recent colonoscopy found a single 4 mm polyp in the cecum and recommended repeat in 3 years.  His last visit he was doing well, previously had left-sided abdominal pain presumed to be diverticulitis and treated with antibiotics per primary care.  Significantly improved at his last visit.  Rare intermittent discomfort.  No other overt GI complaints.  Does have intermittent constipation, most recently constipated for a full week.  Recommended call for any worsening pain, colonoscopy, diverticula patient information, follow-up in 4 months.  Colonoscopy was completed 11/02/2019 which found 2 polyps ranging from 5 mm to 11 mm in the rectum and cecum status post removal, diverticulosis in the sigmoid colon, otherwise normal.  Surgical pathology found the polyps to be tubular adenoma in the cecum and hyperplastic in the rectum.  Recommended repeat colonoscopy in 3 years (2023).  Today he states he's doing well overall. Tried probiotic and since then his abdominal pain has resolved. Has only had one episode of mild abdominal pain since then. Denies abdominal pain, N/V, hematochezia, melena, fever, chills,  unintentional weight loss. Denies URI or flu-like symptoms. Denies loss of sense of taste or smell. Tested for COVID-19 prior to TCS and was negative. Denies chest pain, dyspnea, dizziness, lightheadedness, syncope, near syncope. Denies any other upper or lower GI symptoms.  Past Medical History:  Diagnosis Date  . GERD (gastroesophageal reflux disease)   . Hypercholesterolemia   . Hypertension     Past Surgical History:  Procedure Laterality Date  . CHOLECYSTECTOMY    . COLONOSCOPY N/A 04/02/2016   RMR: diverticulosis sigmoid colon, six 51mm polyps removed, 50mm polyp in desc colon removed piecemeal using hot snare. tattooed. TCS 10/2016. tubular adenomas  . COLONOSCOPY N/A 10/01/2016   Procedure: COLONOSCOPY;  Surgeon: Daneil Dolin, MD;  [facility];  Service: Endoscopy;  Laterality: N/A;  730  . COLONOSCOPY N/A 11/02/2019   Procedure: COLONOSCOPY;  Surgeon: Daneil Dolin, MD;  [facility];  Service: Endoscopy;  Laterality: N/A;  8:30am  . ESOPHAGOGASTRODUODENOSCOPY N/A 06/18/2016   Procedure: ESOPHAGOGASTRODUODENOSCOPY (EGD);  Surgeon: Daneil Dolin, MD;  [facility];  Service: Endoscopy;  Laterality: N/A;  745  . FOOT SURGERY    . GIVENS CAPSULE STUDY N/A 06/27/2016   Procedure: GIVENS CAPSULE STUDY;  Surgeon: Daneil Dolin, MD;  [facility];  Service: Endoscopy;  Laterality: N/A;  0700  . POLYPECTOMY  11/02/2019   Procedure: POLYPECTOMY;  Surgeon: Daneil Dolin, MD;  [facility];  Service: Endoscopy;;  cecal,rectal    Current Outpatient Medications  Medication Sig  Dispense Refill  . aspirin EC 81 MG tablet Take 81 mg by mouth daily.     . calcium carbonate (OSCAL) 1500 (600 Ca) MG TABS tablet Take 600 mg of elemental calcium by mouth at bedtime.    . Cholecalciferol (VITAMIN D) 50 MCG (2000 UT) CAPS Take 2,000 Units by mouth at bedtime.     . hydrochlorothiazide (HYDRODIURIL) 25 MG tablet Take 25 mg by mouth daily.      Marland Kitchen ibuprofen (ADVIL,MOTRIN) 200 MG tablet Take 200 mg by mouth as needed (for pain.).     Marland Kitchen levothyroxine (SYNTHROID) 25 MCG tablet Take 25 mcg by mouth daily.     . Magnesium 400 MG CAPS Take 400 mg by mouth daily.     . Multiple Vitamin (MULTIVITAMIN) tablet Take 1 tablet by mouth daily. 50 +    . Omega-3 Fatty Acids (FISH OIL) 1000 MG CPDR Take 2,000 mcg by mouth every evening.     . Probiotic Product (PROBIOTIC PO) Take by mouth daily.    . simvastatin (ZOCOR) 20 MG tablet Take 20 mg by mouth at bedtime.     . vitamin C (ASCORBIC ACID) 500 MG tablet Take 500 mg by mouth daily.    . Vitamin E 400 units TABS Take 400 Units by mouth at bedtime.     Marland Kitchen zinc gluconate 50 MG tablet Take 50 mg by mouth at bedtime.     No current facility-administered medications for this visit.    Allergies as of 01/17/2020 - Review Complete 01/17/2020  Allergen Reaction Noted  . Iron Rash 10/20/2019  . Latex Rash 09/26/2016    Family History  Problem Relation Age of Onset  . Colon cancer Mother        early 19s  . Colon cancer Maternal Aunt     Social History   Socioeconomic History  . Marital status: Married    Spouse name: Not on file  . Number of children: Not on file  . Years of education: Not on file  . Highest education level: Not on file  Occupational History  . Not on file  Tobacco Use  . Smoking status: Never Smoker  . Smokeless tobacco: Never Used  . Tobacco comment: Never smoked  Substance and Sexual Activity  . Alcohol use: No    Alcohol/week: 0.0 standard drinks  . Drug use: No  . Sexual activity: Not on file  Other Topics Concern  . Not on file  Social History Narrative  . Not on file   Social Determinants of Health   Financial Resource Strain:   . Difficulty of Paying Living Expenses: Not on file  Food Insecurity:   . Worried About Charity fundraiser in the Last Year: Not on file  . Ran Out of Food in the Last Year: Not on file  Transportation Needs:   . Lack  of Transportation (Medical): Not on file  . Lack of Transportation (Non-Medical): Not on file  Physical Activity:   . Days of Exercise per Week: Not on file  . Minutes of Exercise per Session: Not on file  Stress:   . Feeling of Stress : Not on file  Social Connections:   . Frequency of Communication with Friends and Family: Not on file  . Frequency of Social Gatherings with Friends and Family: Not on file  . Attends Religious Services: Not on file  . Active Member of Clubs or Organizations: Not on file  . Attends Archivist Meetings: Not  on file  . Marital Status: Not on file    Review of Systems: General: Negative for anorexia, weight loss, fever, chills, fatigue, weakness. ENT: Negative for hoarseness, difficulty swallowing. CV: Negative for chest pain, angina, palpitations, peripheral edema.  Respiratory: Negative for dyspnea at rest, cough, sputum, wheezing.  GI: See history of present illness. Endo: Negative for unusual weight change.  Heme: Negative for bruising or bleeding. Allergy: Negative for rash or hives.   Physical Exam: BP 138/80   Pulse 78   Temp (!) 96.6 F (35.9 C) (Temporal)   Ht 5\' 6"  (1.676 m)   Wt 168 lb 3.2 oz (76.3 kg)   BMI 27.15 kg/m  General:   Alert and oriented. Pleasant and cooperative. Well-nourished and well-developed.  Eyes:  Without icterus, sclera clear and conjunctiva pink.  Ears:  Normal auditory acuity. Cardiovascular:  S1, S2 present without murmurs appreciated. Extremities without clubbing or edema. Respiratory:  Clear to auscultation bilaterally. No wheezes, rales, or rhonchi. No distress.  Gastrointestinal:  +BS, soft, non-tender and non-distended. No HSM noted. No guarding or rebound. No masses appreciated.  Rectal:  Deferred  Musculoskalatal:  Symmetrical without gross deformities. Skin:  Intact without significant lesions or rashes. Neurologic:  Alert and oriented x4;  grossly normal neurologically. Psych:  Alert and  cooperative. Normal mood and affect. Heme/Lymph/Immune: No excessive bruising noted.    01/17/2020 3:05 PM   Disclaimer: This note was dictated with voice recognition software. Similar sounding words can inadvertently be transcribed and may not be corrected upon review.

## 2020-02-02 ENCOUNTER — Ambulatory Visit: Payer: PRIVATE HEALTH INSURANCE | Attending: Internal Medicine

## 2020-02-02 ENCOUNTER — Other Ambulatory Visit: Payer: Self-pay

## 2020-02-02 DIAGNOSIS — Z23 Encounter for immunization: Secondary | ICD-10-CM | POA: Insufficient documentation

## 2020-02-02 NOTE — Progress Notes (Signed)
   Covid-19 Vaccination Clinic  Name:  Fernando Fisher    MRN: [id number] DOB: 04/29/1956  02/02/2020  [name] was observed post Covid-19 immunization for 15 minutes without incident. He was provided with Vaccine Information Sheet and instruction to access the V-Safe system.   [name] was instructed to call 911 with any severe reactions post vaccine: Marland Kitchen Difficulty breathing  . Swelling of face and throat  . A fast heartbeat  . A bad rash all over body  . Dizziness and weakness   Immunizations Administered    Name Date Dose VIS Date Route   Moderna COVID-19 Vaccine 02/02/2020  9:00 AM 0.5 mL 11/01/2019 Intramuscular   Manufacturer: Moderna   Lot: [id number]   [id number]

## 2020-03-06 ENCOUNTER — Ambulatory Visit: Payer: PRIVATE HEALTH INSURANCE | Attending: Internal Medicine

## 2020-03-06 DIAGNOSIS — Z23 Encounter for immunization: Secondary | ICD-10-CM

## 2020-03-06 NOTE — Progress Notes (Signed)
   Covid-19 Vaccination Clinic  Name:  Fernando Fisher    MRN: [id number] DOB: 11-May-1956  03/06/2020  [name] was observed post Covid-19 immunization for 15 minutes without incident. He was provided with Vaccine Information Sheet and instruction to access the V-Safe system.   [name] was instructed to call 911 with any severe reactions post vaccine: Marland Kitchen Difficulty breathing  . Swelling of face and throat  . A fast heartbeat  . A bad rash all over body  . Dizziness and weakness   Immunizations Administered    Name Date Dose VIS Date Route   Moderna COVID-19 Vaccine 03/06/2020  8:16 AM 0.5 mL 11/01/2019 Intramuscular   Manufacturer: Moderna   LotHQ:[id number]   [place]:[id number]

## 2020-07-02 ENCOUNTER — Encounter: Payer: Self-pay | Admitting: Nurse Practitioner

## 2020-07-17 ENCOUNTER — Ambulatory Visit: Payer: PRIVATE HEALTH INSURANCE | Admitting: Nurse Practitioner

## 2020-07-26 DIAGNOSIS — R110 Nausea: Secondary | ICD-10-CM | POA: Diagnosis not present

## 2020-07-26 DIAGNOSIS — R42 Dizziness and giddiness: Secondary | ICD-10-CM | POA: Diagnosis not present

## 2020-08-29 ENCOUNTER — Ambulatory Visit (INDEPENDENT_AMBULATORY_CARE_PROVIDER_SITE_OTHER): Payer: PPO | Admitting: Nurse Practitioner

## 2020-08-29 ENCOUNTER — Other Ambulatory Visit: Payer: Self-pay

## 2020-08-29 ENCOUNTER — Encounter: Payer: Self-pay | Admitting: Nurse Practitioner

## 2020-08-29 VITALS — BP 129/77 | HR 73 | Temp 98.0°F | Ht 66.0 in | Wt 160.0 lb

## 2020-08-29 DIAGNOSIS — K219 Gastro-esophageal reflux disease without esophagitis: Secondary | ICD-10-CM | POA: Diagnosis not present

## 2020-08-29 DIAGNOSIS — K5900 Constipation, unspecified: Secondary | ICD-10-CM | POA: Insufficient documentation

## 2020-08-29 DIAGNOSIS — R109 Unspecified abdominal pain: Secondary | ICD-10-CM

## 2020-08-29 DIAGNOSIS — Z800 Family history of malignant neoplasm of digestive organs: Secondary | ICD-10-CM | POA: Diagnosis not present

## 2020-08-29 NOTE — Progress Notes (Signed)
Referring Provider: Celene Squibb, MD Primary Care Physician:  Celene Squibb, MD Primary GI:  Dr. Gala Romney  Chief Complaint  Patient presents with  . Follow-up    HPI:   Fernando Fisher is a 64 y.o. male who presents for follow-up on abdominal pain.  The patient was last seen in our office 01/17/2020 for left upper quadrant abdominal pain history of colon polyps.  Previously abdominal pain felt likely due to constipation.  History of IDA status post colonoscopy, endoscopy, and Givens capsule endoscopy without overt cause.  Ferritin normalized in 2017.  Abdominal pain also previously deemed due to diverticulitis status post antibiotic treatment.  Colonoscopy up-to-date 2020 with 2 tubular adenoma polyps and recommended repeat in 2023.  At his last visit tried probiotic and abdominal pain resolved with only one episode since.  No other overt GI complaints.  Recommended continue probiotics, plans repeat colonoscopy in 2023, follow-up in 6 months.  Today he states he doing okay overall. Denies any further abdominal pain. Probiotics are still working well for him. Drinks adequate water daily and 3 cups of coffee which will trigger him to have a bowel movement. Stools consistent with [place] 4, no straining. Denies N/V, hematochezia, melena, fever, chills, unintentional weight loss. He is actively trying to lose weight. Denies URI or flu-like symptoms. Denies loss of sense of taste or smell. The patient has received COVID-19 vaccination(s). Denies chest pain, dyspnea, dizziness, lightheadedness, syncope, near syncope. Denies any other upper or lower GI symptoms.  Past Medical History:  Diagnosis Date  . GERD (gastroesophageal reflux disease)   . Hypercholesterolemia   . Hypertension     Past Surgical History:  Procedure Laterality Date  . CHOLECYSTECTOMY    . COLONOSCOPY N/A 04/02/2016   RMR: diverticulosis sigmoid colon, six 16mm polyps removed, 6mm polyp in desc colon removed piecemeal using hot  snare. tattooed. TCS 10/2016. tubular adenomas  . COLONOSCOPY N/A 10/01/2016   Procedure: COLONOSCOPY;  Surgeon: Daneil Dolin, MD;  [facility];  Service: Endoscopy;  Laterality: N/A;  730  . COLONOSCOPY N/A 11/02/2019   Procedure: COLONOSCOPY;  Surgeon: Daneil Dolin, MD;  [facility];  Service: Endoscopy;  Laterality: N/A;  8:30am  . ESOPHAGOGASTRODUODENOSCOPY N/A 06/18/2016   Procedure: ESOPHAGOGASTRODUODENOSCOPY (EGD);  Surgeon: Daneil Dolin, MD;  [facility];  Service: Endoscopy;  Laterality: N/A;  745  . FOOT SURGERY    . GIVENS CAPSULE STUDY N/A 06/27/2016   Procedure: GIVENS CAPSULE STUDY;  Surgeon: Daneil Dolin, MD;  [facility];  Service: Endoscopy;  Laterality: N/A;  0700  . POLYPECTOMY  11/02/2019   Procedure: POLYPECTOMY;  Surgeon: Daneil Dolin, MD;  [facility];  Service: Endoscopy;;  cecal,rectal    Current Outpatient Medications  Medication Sig Dispense Refill  . aspirin EC 81 MG tablet Take 81 mg by mouth daily.     . calcium carbonate (OSCAL) 1500 (600 Ca) MG TABS tablet Take 600 mg of elemental calcium by mouth at bedtime.    . Cholecalciferol (VITAMIN D) 50 MCG (2000 UT) CAPS Take 2,000 Units by mouth at bedtime.     . hydrochlorothiazide (HYDRODIURIL) 25 MG tablet Take 25 mg by mouth daily.     Marland Kitchen ibuprofen (ADVIL,MOTRIN) 200 MG tablet Take 200 mg by mouth as needed (for pain.).     Marland Kitchen levothyroxine (SYNTHROID) 25 MCG tablet Take 25 mcg by mouth daily.     . Magnesium  400 MG CAPS Take 400 mg by mouth daily.     . Multiple Vitamin (MULTIVITAMIN) tablet Take 1 tablet by mouth daily. 50 +    . Omega-3 Fatty Acids (FISH OIL) 1000 MG CPDR Take 2,000 mcg by mouth every evening.     . Probiotic Product (PROBIOTIC PO) Take by mouth daily.    . simvastatin (ZOCOR) 20 MG tablet Take 20 mg by mouth at bedtime.     . vitamin C (ASCORBIC ACID) 500 MG tablet Take 500 mg by mouth daily.    Marland Kitchen zinc gluconate 50 MG tablet  Take 50 mg by mouth at bedtime.     No current facility-administered medications for this visit.    Allergies as of 08/29/2020 - Review Complete 08/29/2020  Allergen Reaction Noted  . Iron Rash 10/20/2019  . Latex Rash 09/26/2016    Family History  Problem Relation Age of Onset  . Colon cancer Mother        early 52s  . Colon cancer Maternal Aunt     Social History   Socioeconomic History  . Marital status: Married    Spouse name: Not on file  . Number of children: Not on file  . Years of education: Not on file  . Highest education level: Not on file  Occupational History  . Not on file  Tobacco Use  . Smoking status: Never Smoker  . Smokeless tobacco: Never Used  . Tobacco comment: Never smoked  Substance and Sexual Activity  . Alcohol use: No    Alcohol/week: 0.0 standard drinks  . Drug use: No  . Sexual activity: Not on file  Other Topics Concern  . Not on file  Social History Narrative  . Not on file   Social Determinants of Health   Financial Resource Strain:   . Difficulty of Paying Living Expenses: Not on file  Food Insecurity:   . Worried About Charity fundraiser in the Last Year: Not on file  . Ran Out of Food in the Last Year: Not on file  Transportation Needs:   . Lack of Transportation (Medical): Not on file  . Lack of Transportation (Non-Medical): Not on file  Physical Activity:   . Days of Exercise per Week: Not on file  . Minutes of Exercise per Session: Not on file  Stress:   . Feeling of Stress : Not on file  Social Connections:   . Frequency of Communication with Friends and Family: Not on file  . Frequency of Social Gatherings with Friends and Family: Not on file  . Attends Religious Services: Not on file  . Active Member of Clubs or Organizations: Not on file  . Attends Archivist Meetings: Not on file  . Marital Status: Not on file    Subjective: Review of Systems  Constitutional: Negative for chills, fever,  malaise/fatigue and weight loss.  HENT: Negative for congestion and sore throat.   Respiratory: Negative for cough and shortness of breath.   Cardiovascular: Negative for chest pain and palpitations.  Gastrointestinal: Negative for abdominal pain, blood in stool, diarrhea, melena, nausea and vomiting.  Musculoskeletal: Negative for joint pain and myalgias.  Skin: Negative for rash.  Neurological: Negative for dizziness and weakness.  Endo/Heme/Allergies: Does not bruise/bleed easily.  Psychiatric/Behavioral: Negative for depression. The patient is not nervous/anxious.   All other systems reviewed and are negative.    Objective: BP 129/77   Pulse 73   Temp 98 F (36.7 C) (Temporal)  Ht 5\' 6"  (1.676 m)   Wt 160 lb (72.6 kg)   BMI 25.82 kg/m  Physical Exam Vitals and nursing note reviewed.  Constitutional:      General: He is not in acute distress.    Appearance: Normal appearance. He is not ill-appearing, toxic-appearing or diaphoretic.  HENT:     Head: Normocephalic and atraumatic.     Nose: No congestion or rhinorrhea.  Eyes:     General: No scleral icterus. Cardiovascular:     Rate and Rhythm: Normal rate and regular rhythm.     Heart sounds: Normal heart sounds.  Pulmonary:     Effort: Pulmonary effort is normal.     Breath sounds: Normal breath sounds.  Abdominal:     General: Bowel sounds are normal. There is no distension.     Palpations: Abdomen is soft. There is no hepatomegaly, splenomegaly or mass.     Tenderness: There is no abdominal tenderness. There is no guarding or rebound.     Hernia: No hernia is present.  Musculoskeletal:     Cervical back: Neck supple.  Skin:    General: Skin is warm and dry.     Coloration: Skin is not jaundiced.     Findings: No bruising or rash.  Neurological:     General: No focal deficit present.     Mental Status: He is alert and oriented to person, place, and time. Mental status is at baseline.  Psychiatric:        Mood  and Affect: Mood normal.        Behavior: Behavior normal.        Thought Content: Thought content normal.      Assessment:  Very pleasant 64 year old male with a history of abdominal pain, constipation, family history of colon cancer.  He is up-to-date on his colonoscopy screenings will next be due in 2023.  Overall his symptoms have improved.  Having appropriate bowel movements, typically in the morning, after breakfast, water, and 3 cups of coffee.  No straining or hard stools.  Abdominal pain remains resolved with improvement in constipation and starting probiotics.  Previous history of GERD also doing well, no longer requiring PPI.  Overall doing well   Plan: 1. Continue current medications 2. Let us know for any worsening or severe symptoms 3. Follow-up in 1 year.    Thank you for allowing [place] to participate in the care of Winfred, DNP, AGNP-C Adult & Gerontological Nurse Practitioner [facility]   08/29/2020 1:47 PM   Disclaimer: This note was dictated with voice recognition software. Similar sounding words can inadvertently be transcribed and may not be corrected upon review.

## 2020-08-29 NOTE — Patient Instructions (Signed)
Your health issues we discussed today were:   Abdominal pain, previous constipation: 1. Continue taking your current medications including probiotics 2. Let us know if you have any worsening or severe symptoms 3. Let us know if constipation becomes more problematic  Family history of colon cancer and personal history of colon polyps: 1. As we discussed you are up-to-date on your colon cancer screening 2. Your next be due in 2023 3. When it is time to schedule your next colonoscopy our office will send you a letter  Overall I recommend:  1. Continue your other current medications 2. Return for follow-up in 1 year 3. Call us if you have any questions or concerns   ---------------------------------------------------------------  I am glad you have gotten your COVID-19 vaccination!  Even though you are fully vaccinated you should continue to follow CDC and state/local guidelines.  ---------------------------------------------------------------   At [facility] we value your feedback. You may receive a survey about your visit today. Please share your experience as we strive to create trusting relationships with our patients to provide genuine, compassionate, quality care.  We appreciate your understanding and patience as we review any laboratory studies, imaging, and other diagnostic tests that are ordered as we care for you. Our office policy is 5 business days for review of these results, and any emergent or urgent results are addressed in a timely manner for your best interest. If you do not hear from our office in 1 week, please contact us.   We also encourage the use of MyChart, which contains your medical information for your review as well. If you are not enrolled in this feature, an access code is on this after visit summary for your convenience. Thank you for allowing [place] to be involved in your care.  It was great to see you today!  I hope you have a great  Fall!!

## 2020-09-14 DIAGNOSIS — R946 Abnormal results of thyroid function studies: Secondary | ICD-10-CM | POA: Diagnosis not present

## 2020-09-14 DIAGNOSIS — D649 Anemia, unspecified: Secondary | ICD-10-CM | POA: Diagnosis not present

## 2020-09-14 DIAGNOSIS — E785 Hyperlipidemia, unspecified: Secondary | ICD-10-CM | POA: Diagnosis not present

## 2020-09-14 DIAGNOSIS — I10 Essential (primary) hypertension: Secondary | ICD-10-CM | POA: Diagnosis not present

## 2020-09-14 DIAGNOSIS — E782 Mixed hyperlipidemia: Secondary | ICD-10-CM | POA: Diagnosis not present

## 2020-09-14 DIAGNOSIS — D509 Iron deficiency anemia, unspecified: Secondary | ICD-10-CM | POA: Diagnosis not present

## 2020-09-20 DIAGNOSIS — R1030 Lower abdominal pain, unspecified: Secondary | ICD-10-CM | POA: Diagnosis not present

## 2020-09-20 DIAGNOSIS — R770 Abnormality of albumin: Secondary | ICD-10-CM | POA: Diagnosis not present

## 2020-09-20 DIAGNOSIS — R946 Abnormal results of thyroid function studies: Secondary | ICD-10-CM | POA: Diagnosis not present

## 2020-09-20 DIAGNOSIS — I10 Essential (primary) hypertension: Secondary | ICD-10-CM | POA: Diagnosis not present

## 2020-09-20 DIAGNOSIS — M1612 Unilateral primary osteoarthritis, left hip: Secondary | ICD-10-CM | POA: Diagnosis not present

## 2020-09-20 DIAGNOSIS — Z6826 Body mass index (BMI) 26.0-26.9, adult: Secondary | ICD-10-CM | POA: Diagnosis not present

## 2020-09-20 DIAGNOSIS — E782 Mixed hyperlipidemia: Secondary | ICD-10-CM | POA: Diagnosis not present

## 2020-09-20 DIAGNOSIS — R252 Cramp and spasm: Secondary | ICD-10-CM | POA: Diagnosis not present

## 2020-09-20 DIAGNOSIS — E876 Hypokalemia: Secondary | ICD-10-CM | POA: Diagnosis not present

## 2020-09-20 DIAGNOSIS — M1712 Unilateral primary osteoarthritis, left knee: Secondary | ICD-10-CM | POA: Diagnosis not present

## 2020-09-20 DIAGNOSIS — K635 Polyp of colon: Secondary | ICD-10-CM | POA: Diagnosis not present

## 2020-10-10 DIAGNOSIS — M79602 Pain in left arm: Secondary | ICD-10-CM | POA: Diagnosis not present

## 2020-10-10 DIAGNOSIS — Z6827 Body mass index (BMI) 27.0-27.9, adult: Secondary | ICD-10-CM | POA: Diagnosis not present

## 2020-10-10 DIAGNOSIS — M79605 Pain in left leg: Secondary | ICD-10-CM | POA: Diagnosis not present

## 2020-10-10 DIAGNOSIS — Z712 Person consulting for explanation of examination or test findings: Secondary | ICD-10-CM | POA: Diagnosis not present

## 2020-10-10 DIAGNOSIS — L237 Allergic contact dermatitis due to plants, except food: Secondary | ICD-10-CM | POA: Diagnosis not present

## 2020-10-10 DIAGNOSIS — D509 Iron deficiency anemia, unspecified: Secondary | ICD-10-CM | POA: Diagnosis not present

## 2020-10-10 DIAGNOSIS — R946 Abnormal results of thyroid function studies: Secondary | ICD-10-CM | POA: Diagnosis not present

## 2020-10-10 DIAGNOSIS — G3184 Mild cognitive impairment, so stated: Secondary | ICD-10-CM | POA: Diagnosis not present

## 2020-10-10 DIAGNOSIS — L509 Urticaria, unspecified: Secondary | ICD-10-CM | POA: Diagnosis not present

## 2020-10-10 DIAGNOSIS — L239 Allergic contact dermatitis, unspecified cause: Secondary | ICD-10-CM | POA: Diagnosis not present

## 2020-10-10 DIAGNOSIS — E782 Mixed hyperlipidemia: Secondary | ICD-10-CM | POA: Diagnosis not present

## 2020-10-10 DIAGNOSIS — Z86010 Personal history of colonic polyps: Secondary | ICD-10-CM | POA: Diagnosis not present

## 2020-10-16 DIAGNOSIS — R770 Abnormality of albumin: Secondary | ICD-10-CM | POA: Diagnosis not present

## 2020-10-16 DIAGNOSIS — E876 Hypokalemia: Secondary | ICD-10-CM | POA: Diagnosis not present

## 2020-10-16 DIAGNOSIS — M1712 Unilateral primary osteoarthritis, left knee: Secondary | ICD-10-CM | POA: Diagnosis not present

## 2020-10-16 DIAGNOSIS — E782 Mixed hyperlipidemia: Secondary | ICD-10-CM | POA: Diagnosis not present

## 2020-10-16 DIAGNOSIS — Z6826 Body mass index (BMI) 26.0-26.9, adult: Secondary | ICD-10-CM | POA: Diagnosis not present

## 2020-10-16 DIAGNOSIS — R252 Cramp and spasm: Secondary | ICD-10-CM | POA: Diagnosis not present

## 2020-10-16 DIAGNOSIS — I10 Essential (primary) hypertension: Secondary | ICD-10-CM | POA: Diagnosis not present

## 2020-10-16 DIAGNOSIS — R1030 Lower abdominal pain, unspecified: Secondary | ICD-10-CM | POA: Diagnosis not present

## 2020-10-16 DIAGNOSIS — R946 Abnormal results of thyroid function studies: Secondary | ICD-10-CM | POA: Diagnosis not present

## 2020-10-16 DIAGNOSIS — K635 Polyp of colon: Secondary | ICD-10-CM | POA: Diagnosis not present

## 2020-10-16 DIAGNOSIS — M1612 Unilateral primary osteoarthritis, left hip: Secondary | ICD-10-CM | POA: Diagnosis not present

## 2020-11-07 ENCOUNTER — Ambulatory Visit: Payer: PPO

## 2020-11-07 ENCOUNTER — Ambulatory Visit: Payer: PPO | Admitting: Orthopedic Surgery

## 2020-11-07 ENCOUNTER — Other Ambulatory Visit: Payer: Self-pay | Admitting: Orthopedic Surgery

## 2020-11-07 ENCOUNTER — Encounter: Payer: Self-pay | Admitting: Orthopedic Surgery

## 2020-11-07 ENCOUNTER — Other Ambulatory Visit: Payer: Self-pay

## 2020-11-07 VITALS — BP 158/87 | HR 78 | Ht 66.0 in | Wt 156.0 lb

## 2020-11-07 DIAGNOSIS — M545 Low back pain, unspecified: Secondary | ICD-10-CM | POA: Diagnosis not present

## 2020-11-07 DIAGNOSIS — G8929 Other chronic pain: Secondary | ICD-10-CM | POA: Diagnosis not present

## 2020-11-07 DIAGNOSIS — M25551 Pain in right hip: Secondary | ICD-10-CM | POA: Diagnosis not present

## 2020-11-07 NOTE — Progress Notes (Signed)
NEW PROBLEM//OFFICE VISIT  Chief Complaint  Patient presents with  . Hip Pain    Rt side hip for over a year. Has had an injection in the hip before. Pain is worse after sitting for periods of time/driving.    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  He apparently received an injection in his right hip but is not sure exactly relief with the medication.  His wife thinks they put it in the groin it did not improve his symptoms  He comes in complaining of pain over his right lower back and lateral right leg and buttock.  No radiation of pain to the knee or to the foot.  No groin or thigh pain.  Notes more severe symptoms when driving especially after about a half an hour.   Review of Systems  Constitutional: Negative for fever.  Respiratory: Negative for shortness of breath.   Cardiovascular: Negative for chest pain.  Skin: Negative.   Neurological: Negative for tingling and sensory change.     Past Medical History:  Diagnosis Date  . GERD (gastroesophageal reflux disease)   . Hypercholesterolemia   . Hypertension     Past Surgical History:  Procedure Laterality Date  . CHOLECYSTECTOMY    . COLONOSCOPY N/A 04/02/2016   RMR: diverticulosis sigmoid colon, six 46mm polyps removed, 56mm polyp in desc colon removed piecemeal using hot snare. tattooed. TCS 10/2016. tubular adenomas  . COLONOSCOPY N/A 10/01/2016   Procedure: COLONOSCOPY;  Surgeon: Daneil Dolin, MD;  [facility];  Service: Endoscopy;  Laterality: N/A;  730  . COLONOSCOPY N/A 11/02/2019   Procedure: COLONOSCOPY;  Surgeon: Daneil Dolin, MD;  [facility];  Service: Endoscopy;  Laterality: N/A;  8:30am  . ESOPHAGOGASTRODUODENOSCOPY N/A 06/18/2016   Procedure: ESOPHAGOGASTRODUODENOSCOPY (EGD);  Surgeon: Daneil Dolin, MD;  [facility];  Service: Endoscopy;  Laterality: N/A;  745  . FOOT SURGERY    . GIVENS CAPSULE STUDY N/A 06/27/2016    Procedure: GIVENS CAPSULE STUDY;  Surgeon: Daneil Dolin, MD;  [facility];  Service: Endoscopy;  Laterality: N/A;  0700  . POLYPECTOMY  11/02/2019   Procedure: POLYPECTOMY;  Surgeon: Daneil Dolin, MD;  [facility];  Service: Endoscopy;;  cecal,rectal    Family History  Problem Relation Age of Onset  . Colon cancer Mother        early 79s  . Colon cancer Maternal Aunt    Social History   Tobacco Use  . Smoking status: Never Smoker  . Smokeless tobacco: Never Used  . Tobacco comment: Never smoked  Substance Use Topics  . Alcohol use: No    Alcohol/week: 0.0 standard drinks  . Drug use: No    Allergies  Allergen Reactions  . Iron Rash  . Latex Rash    Current Meds  Medication Sig  . aspirin EC 81 MG tablet Take 81 mg by mouth daily.   . Cholecalciferol (VITAMIN D) 50 MCG (2000 UT) CAPS Take 2,000 Units by mouth at bedtime.   . hydrochlorothiazide (HYDRODIURIL) 25 MG tablet Take 25 mg by mouth daily.   Marland Kitchen levothyroxine (SYNTHROID) 25 MCG tablet Take 25 mcg by mouth daily.   . Magnesium 400 MG CAPS Take 400 mg by mouth daily.   . Omega-3 Fatty Acids (FISH OIL) 1000 MG CPDR Take 2,000 mcg by mouth every evening.   . Probiotic Product (PROBIOTIC PO) Take by mouth  daily.  . simvastatin (ZOCOR) 20 MG tablet Take 20 mg by mouth at bedtime.   . vitamin C (ASCORBIC ACID) 500 MG tablet Take 500 mg by mouth daily.    BP (!) 158/87   Pulse 78   Ht 5\' 6"  (1.676 m)   Wt 156 lb (70.8 kg)   BMI 25.18 kg/m   Physical Exam Constitutional:      General: He is not in acute distress.    Appearance: He is well-developed.  Cardiovascular:     Comments: No peripheral edema Skin:    General: Skin is warm and dry.  Neurological:     Mental Status: He is alert and oriented to person, place, and time.     Sensory: No sensory deficit.     Coordination: Coordination normal.     Gait: Gait normal.     Deep Tendon Reflexes: Reflexes are normal and symmetric.   Psychiatric:        Mood and Affect: Mood normal.        Behavior: Behavior normal.        Thought Content: Thought content normal.        Judgment: Judgment normal.     Right Hip Exam  Right hip exam is normal.   Range of Motion  The patient has normal right hip ROM.  Muscle Strength  The patient has normal right hip strength.  Other  Erythema: absent Scars: absent Sensation: normal Pulse: present   Left Hip Exam  Left hip exam is normal.  Range of Motion  The patient has normal left hip ROM.  Muscle Strength  The patient has normal left hip strength.   Other  Erythema: absent Scars: absent Sensation: normal Pulse: present   Back Exam   Tenderness  The patient is experiencing tenderness in the lumbar.  Range of Motion  Flexion: abnormal   Other  Gait: normal         MEDICAL DECISION MAKING  A.  Encounter Diagnoses  Name Primary?  . Chronic hip pain, right   . Lumbar pain Yes    B. 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 MANAGEMENT   Continue meloxicam  Visit to therapy for home therapy exercises  Return if no improvement  No orders of the defined types were placed in this encounter.     Arther Abbott, MD  11/07/2020 9:31 AM

## 2020-11-07 NOTE — Patient Instructions (Addendum)
Take meloxicam with food  Go to therapy for instructions on home exercise program  Return if no improvement Chronic Back Pain When back pain lasts longer than 3 months, it is called chronic back pain. Pain may get worse at certain times (flare-ups). There are things you can do at home to manage your pain. Follow these instructions at home: Activity      Avoid bending and other activities that make pain worse.  When standing: ? Keep your upper back and neck straight. ? Keep your shoulders pulled back. ? Avoid slouching.  When sitting: ? Keep your back straight. ? Relax your shoulders. Do not round your shoulders or pull them backward.  Do not sit or stand in one place for long periods of time.  Take short rest breaks during the day. Lying down or standing is usually better than sitting. Resting can help relieve pain.  When sitting or lying down for a long time, do some mild activity or stretching. This will help to prevent stiffness and pain.  Get regular exercise. Ask your doctor what activities are safe for you.  Do not lift anything that is heavier than 10 lb (4.5 kg). To prevent injury when you lift things: ? Bend your knees. ? Keep the weight close to your body. ? Avoid twisting. Managing pain  If told, put ice on the painful area. Your doctor may tell you to use ice for 24-48 hours after a flare-up starts. ? Put ice in a plastic bag. ? Place a towel between your skin and the bag. ? Leave the ice on for 20 minutes, 2-3 times a day.  If told, put heat on the painful area as often as told by your doctor. Use the heat source that your doctor recommends, such as a moist heat pack or a heating pad. ? Place a towel between your skin and the heat source. ? Leave the heat on for 20-30 minutes. ? Remove the heat if your skin turns bright red. This is especially important if you are unable to feel pain, heat, or cold. You may have a greater risk of getting burned.  Soak in a  warm bath. This can help relieve pain.  Take over-the-counter and prescription medicines only as told by your doctor. General instructions  Sleep on a firm mattress. Try lying on your side with your knees slightly bent. If you lie on your back, put a pillow under your knees.  Keep all follow-up visits as told by your doctor. This is important. Contact a doctor if:  You have pain that does not get better with rest or medicine. Get help right away if:  One or both of your arms or legs feel weak.  One or both of your arms or legs lose feeling (numbness).  You have trouble controlling when you poop (bowel movement) or pee (urinate).  You feel sick to your stomach (nauseous).  You throw up (vomit).  You have belly (abdominal) pain.  You have shortness of breath.  You pass out (faint). Summary  When back pain lasts longer than 3 months, it is called chronic back pain.  Pain may get worse at certain times (flare-ups).  Use ice and heat as told by your doctor. Your doctor may tell you to use ice after flare-ups. This information is not intended to replace advice given to you by your health care provider. Make sure you discuss any questions you have with your health care provider. Document Revised: 03/10/2019 Document Reviewed:  07/02/2017 Elsevier Patient Education  [place].

## 2020-11-12 ENCOUNTER — Other Ambulatory Visit: Payer: Self-pay

## 2020-11-12 ENCOUNTER — Ambulatory Visit (HOSPITAL_COMMUNITY): Payer: PPO | Attending: Orthopedic Surgery | Admitting: Physical Therapy

## 2020-11-12 ENCOUNTER — Encounter (HOSPITAL_COMMUNITY): Payer: Self-pay | Admitting: Physical Therapy

## 2020-11-12 DIAGNOSIS — M25551 Pain in right hip: Secondary | ICD-10-CM | POA: Diagnosis not present

## 2020-11-12 DIAGNOSIS — G8929 Other chronic pain: Secondary | ICD-10-CM | POA: Diagnosis not present

## 2020-11-12 DIAGNOSIS — M545 Low back pain, unspecified: Secondary | ICD-10-CM | POA: Insufficient documentation

## 2020-11-12 NOTE — Therapy (Signed)
[place], [place], [address] Phone: [phone]   Fax:  [phone]  Physical Therapy Evaluation  Patient Details  Name: Fernando Fisher MRN: [id number] Date of Birth: [birth year] Referring Provider (PT): Arther Abbott   Encounter Date: 11/12/2020   PT End of Session - 11/12/20 1121    Visit Number 1    Number of Visits 4    Date for PT Re-Evaluation 12/24/20    Authorization Type [employer]- no auth required, no visit limit    PT Start Time 1130    PT Stop Time 1211    PT Time Calculation (min) 41 min    Activity Tolerance Patient tolerated treatment well           Past Medical History:  Diagnosis Date   GERD (gastroesophageal reflux disease)    Hypercholesterolemia    Hypertension     Past Surgical History:  Procedure Laterality Date   CHOLECYSTECTOMY     COLONOSCOPY N/A 04/02/2016   RMR: diverticulosis sigmoid colon, six 70mm polyps removed, 8mm polyp in desc colon removed piecemeal using hot snare. tattooed. TCS 10/2016. tubular adenomas   COLONOSCOPY N/A 10/01/2016   Procedure: COLONOSCOPY;  Surgeon: Daneil Dolin, MD;  [facility];  Service: Endoscopy;  Laterality: N/A;  730   COLONOSCOPY N/A 11/02/2019   Procedure: COLONOSCOPY;  Surgeon: Daneil Dolin, MD;  [facility];  Service: Endoscopy;  Laterality: N/A;  8:30am   ESOPHAGOGASTRODUODENOSCOPY N/A 06/18/2016   Procedure: ESOPHAGOGASTRODUODENOSCOPY (EGD);  Surgeon: Daneil Dolin, MD;  [facility];  Service: Endoscopy;  Laterality: N/A;  745   FOOT SURGERY     GIVENS CAPSULE STUDY N/A 06/27/2016   Procedure: GIVENS CAPSULE STUDY;  Surgeon: Daneil Dolin, MD;  [facility];  Service: Endoscopy;  Laterality: N/A;  0700   POLYPECTOMY  11/02/2019   Procedure: POLYPECTOMY;  Surgeon: Daneil Dolin, MD;  [facility];  Service: Endoscopy;;  cecal,rectal    There were  no vitals filed for this visit.    Subjective Assessment - 11/12/20 1217    Subjective Patient reports onset of LBP and right hip/buttock pain x one year ago without any single event or injury noted.  Patient reports that pain has been gradually getting worse and chief complaint is increased right buttock pain with driving or sitting greater than 20 minutes and is somewhat relieved by position change and walking.    Limitations Sitting    How long can you sit comfortably? 20 min    How long can you stand comfortably? 45 minutes    Currently in Pain? Yes    Pain Score 8     Pain Location Buttocks    Pain Orientation Right    Pain Descriptors / Indicators Aching;Dull    Pain Type Chronic pain    Pain Radiating Towards right buttock to right low back    Pain Onset Other (comment)   one year ago             [facility] PT Assessment - 11/12/20 0001      Assessment   Medical Diagnosis LBP, chronic R hip pain    Referring Provider (PT) [place]   Has the patient fallen in the past 6 months No    Has the patient had a decrease in activity level because of a fear of falling?  No  Is the patient reluctant to leave their home because of a fear of falling?  No      Prior Function   Level of Independence Independent    Vocation Full time employment   custodian   Vocation Requirements walking, bending, stooping, squatting      Observation/Other Assessments   Observations L hip elevated    Focus on Therapeutic Outcomes (FOTO)  77% function      ROM / Strength   AROM / PROM / Strength AROM;Strength      AROM   AROM Assessment Site Hip;Knee;Lumbar    Lumbar Flexion painful, able to reach mid tibia    Lumbar Extension 100% limited, unable to perform prone on elbows without compensation      Flexibility   Soft Tissue Assessment /Muscle Length yes    Hamstrings tightness present with compensatory knee flexion during forward trunk flexion      Special Tests    Other special tests +FABER RLE; -Scour's; +Ely's     Ambulation/Gait   Ambulation/Gait Yes    Ambulation/Gait Assistance 7: Independent    Gait Pattern Antalgic   RLE                     Objective measurements completed on examination: See above findings.               PT Education - 11/12/20 1222    Education Details pt education in HEP activities and prone positioning in addition to sitting/driving with lumbar support. Education in Economist and importance of extension-biased movements    Person(s) Educated Patient    Methods Explanation;Demonstration    Comprehension Verbalized understanding;Returned demonstration            PT Short Term Goals - 11/12/20 1233      PT SHORT TERM GOAL #1   Title Patient will be independent with HEP in order to improve functional outcomes.    Baseline in development    Time 3    Period Weeks    Status New    Target Date 12/03/20      PT SHORT TERM GOAL #2   Title Patient will demo 5 point improvement in FOTO to manifest functional improvement    Baseline 77% functional    Time 3    Period Weeks    Status New    Target Date 12/03/20      PT SHORT TERM GOAL #3   Title Patient will demonstrate normalized gait pattern without RLE antalgia present to improve activity tolerance    Baseline RLE antalgic gait pattern    Time 3    Period Weeks    Status New    Target Date 12/03/20             PT Long Term Goals - 11/12/20 1235      PT LONG TERM GOAL #1   Title Patient will exhibit full, painfree lumbar ROM to improve body mechanics    Baseline painful forward flexion, limited extension ROM    Time 6    Period Weeks    Status New    Target Date 12/24/20      PT LONG TERM GOAL #2   Title Patient will demonstrate decreased pain to 2/10 with sitting > 45 minutes    Baseline 8/10 pain with sitting 20-30 minutes requiring position change    Time 6    Period Weeks    Status New    Target Date  12/24/20                  Plan - 11/12/20 1224    Clinical Impression Statement Patient demonstrates chronic LBP and right hip/buttock pain which is exacerbated by prolonged sitting/driving and is relieved by position change.  Patient demonstrates postural deviations and limited lumbar extension ROM.  As a result of pain and deficits patient ambulates with antalgic pattern.  Patient would benefit from PT services to establish and instruct in HEP and improve lumbar ROM and strength to allieviate right hip pain.  Patient would also benefit from training/instruction in body mechanics to reduce risk for injury during job duties. Patient reports limited time availability for PT treatment sessions at this time due to employment/family obligations    Personal Factors and Comorbidities Comorbidity 1    Comorbidities HTN    Examination-Activity Limitations Bend;Lift;Squat    Examination-Participation Restrictions Driving    Stability/Clinical Decision Making Stable/Uncomplicated    Clinical Decision Making Low    Rehab Potential Good    PT Frequency Other (comment)   up to 1x/week for total of 4 visits over 6 week certification period   PT Duration 6 weeks    PT Treatment/Interventions ADLs/Self Care Home Management;Aquatic Therapy;Cryotherapy;Electrical Stimulation;Ultrasound;Moist Heat;Gait training;Stair training;Functional mobility training;Therapeutic activities;Therapeutic exercise;Balance training;Patient/family education;Neuromuscular re-education;Manual techniques;Passive range of motion;Spinal Manipulations;Joint Manipulations;Dry needling    PT Next Visit Plan confirm/deny extension bias for relieving pain. If extension relieves, progress with prone on elbows, if no relief move to flexion-biased activities    PT Home Exercise Plan initial focus on prone positioning, progressing for prone on elbows, standing extensions    Consulted and Agree with Plan of Care Patient            Patient will benefit from skilled therapeutic intervention in order to improve the following deficits and impairments:  Abnormal gait,Decreased activity tolerance,Decreased range of motion,Decreased strength,Hypomobility,Difficulty walking,Impaired flexibility,Postural dysfunction,Improper body mechanics  Visit Diagnosis: Chronic right-sided low back pain, unspecified whether sciatica present  Pain in right hip     Problem List Patient Active Problem List   Diagnosis Date Noted   Abdominal pain 08/29/2020   Constipation 08/29/2020   Family history of colon cancer 08/29/2020   Anemia 06/12/2016   Melena 06/12/2016   Abdominal pain, epigastric 06/12/2016   History of colonic polyps    GERD 03/27/2010   ABDOMINAL PAIN, LEFT UPPER QUADRANT 03/27/2010   GASTROINTESTINAL XRAY, ABNORMAL 03/27/2010   PUD, HX OF 03/27/2010    1:17 PM, 11/12/20 M. Sherlyn Lees, PT, DPT Physical Therapist- [place] Office Number: [phone]  [place] [address] [place], [place], [address] Phone: [phone]   Fax:  [phone]  Name: Fernando Fisher MRN: [id number] Date of Birth: 1956-09-03

## 2020-11-12 NOTE — Patient Instructions (Signed)
Access Code: [email or web address] URL: [email or web address] Date: 11/12/2020 Prepared by: Sherlyn Lees  Exercises Lying Prone with 1 Pillow - 7 x weekly Standing Lumbar Extension - 1 x daily - 7 x weekly - 3 sets - 10 reps Standing Lumbar Extension with Counter - 1 x daily - 7 x weekly - 3 sets - 10 reps - 3 hold

## 2020-11-28 DIAGNOSIS — D509 Iron deficiency anemia, unspecified: Secondary | ICD-10-CM | POA: Diagnosis not present

## 2020-11-28 DIAGNOSIS — Z712 Person consulting for explanation of examination or test findings: Secondary | ICD-10-CM | POA: Diagnosis not present

## 2020-11-28 DIAGNOSIS — R946 Abnormal results of thyroid function studies: Secondary | ICD-10-CM | POA: Diagnosis not present

## 2020-11-28 DIAGNOSIS — Z6827 Body mass index (BMI) 27.0-27.9, adult: Secondary | ICD-10-CM | POA: Diagnosis not present

## 2020-11-28 DIAGNOSIS — M79605 Pain in left leg: Secondary | ICD-10-CM | POA: Diagnosis not present

## 2020-11-28 DIAGNOSIS — L237 Allergic contact dermatitis due to plants, except food: Secondary | ICD-10-CM | POA: Diagnosis not present

## 2020-11-28 DIAGNOSIS — L509 Urticaria, unspecified: Secondary | ICD-10-CM | POA: Diagnosis not present

## 2020-11-28 DIAGNOSIS — M79602 Pain in left arm: Secondary | ICD-10-CM | POA: Diagnosis not present

## 2020-11-28 DIAGNOSIS — G3184 Mild cognitive impairment, so stated: Secondary | ICD-10-CM | POA: Diagnosis not present

## 2020-11-28 DIAGNOSIS — L239 Allergic contact dermatitis, unspecified cause: Secondary | ICD-10-CM | POA: Diagnosis not present

## 2020-11-28 DIAGNOSIS — Z86010 Personal history of colonic polyps: Secondary | ICD-10-CM | POA: Diagnosis not present

## 2020-11-28 DIAGNOSIS — E782 Mixed hyperlipidemia: Secondary | ICD-10-CM | POA: Diagnosis not present

## 2021-02-05 ENCOUNTER — Other Ambulatory Visit: Payer: Self-pay

## 2021-02-05 ENCOUNTER — Ambulatory Visit: Payer: PPO | Admitting: Orthopedic Surgery

## 2021-02-05 ENCOUNTER — Ambulatory Visit: Payer: PPO

## 2021-02-05 ENCOUNTER — Encounter: Payer: Self-pay | Admitting: Orthopedic Surgery

## 2021-02-05 VITALS — BP 130/76 | HR 77 | Ht 66.0 in | Wt 162.0 lb

## 2021-02-05 DIAGNOSIS — M7712 Lateral epicondylitis, left elbow: Secondary | ICD-10-CM

## 2021-02-05 DIAGNOSIS — M25522 Pain in left elbow: Secondary | ICD-10-CM

## 2021-02-05 NOTE — Patient Instructions (Signed)

## 2021-02-05 NOTE — Progress Notes (Signed)
New Patient Visit  Assessment: Fernando Fisher is a 65 y.o. male with the following: Lateral epicondylitis of left elbow  Plan: Patient's pain and physical exam is most consistent with lateral epicondylitis left elbow.  He states he has a counterforce strap, which he can start to use again.  I have also provided him with some home exercises for him to work on.  I explained the pathology behind this condition.  I do anticipate that he will continue to improve, especially if he avoid certain activities.  He is also interested in a steroid injection.  The risks of injecting this area were discussed with the patient, and he is elected to proceed.  Follow-up as needed  Procedure note injection - Left lateral epicondyle; in area of common extensor tendon origin   Verbal consent was obtained to inject the Left lateral elbow area Timeout was completed to confirm the site of injection.  The skin was prepped with alcohol and ethyl chloride was sprayed at the injection site.  A 21-gauge needle was used to inject approximately 3 cc of 6 mg of Betamethasone and 1% lidocaine (3 cc) into the left lateral elbow area using a direct lateral approach.  There were no complications. A sterile bandage was applied.    Follow-up: Return if symptoms worsen or fail to improve.  Subjective:  Chief Complaint  Patient presents with  . Elbow Pain    Left elbow, Patient reports some pain and tingling going from elbow down to wrist.  Patient reports 7/10 pain level.     History of Present Illness: Fernando Fisher is a 65 y.o. RHD male who presents for evaluation of left elbow pain.  He has had this pain previously, and that has been intermittent.  More recently, worsening of the pain in the lateral aspect of his left elbow.  He states he does repetitive motions at work, and this is exacerbated his pain.  He previously tried using a strap on the forearm, but has not tried this recently.  He will occasionally take  medications with little improvement in his pain.  No recent physical therapy.  He has never had an injection in his left elbow.  He has had injections elsewhere, which has provided significant relief of joint pain, and he is interested in another injection if this could be beneficial.  The pain starts in the lateral elbow, and radiates distally into his hand.   Review of Systems: No fevers or chills No numbness or tingling No chest pain No shortness of breath No bowel or bladder dysfunction No GI distress No headaches   Medical History:  Past Medical History:  Diagnosis Date  . GERD (gastroesophageal reflux disease)   . Hypercholesterolemia   . Hypertension     Past Surgical History:  Procedure Laterality Date  . CHOLECYSTECTOMY    . COLONOSCOPY N/A 04/02/2016   RMR: diverticulosis sigmoid colon, six 58mm polyps removed, 20mm polyp in desc colon removed piecemeal using hot snare. tattooed. TCS 10/2016. tubular adenomas  . COLONOSCOPY N/A 10/01/2016   Procedure: COLONOSCOPY;  Surgeon: Daneil Dolin, MD;  [facility];  Service: Endoscopy;  Laterality: N/A;  730  . COLONOSCOPY N/A 11/02/2019   Procedure: COLONOSCOPY;  Surgeon: Daneil Dolin, MD;  [facility];  Service: Endoscopy;  Laterality: N/A;  8:30am  . ESOPHAGOGASTRODUODENOSCOPY N/A 06/18/2016   Procedure: ESOPHAGOGASTRODUODENOSCOPY (EGD);  Surgeon: Daneil Dolin, MD;  [facility];  Service: Endoscopy;  Laterality: N/A;  745  . FOOT SURGERY    . GIVENS CAPSULE STUDY N/A 06/27/2016   Procedure: GIVENS CAPSULE STUDY;  Surgeon: Daneil Dolin, MD;  [facility];  Service: Endoscopy;  Laterality: N/A;  0700  . POLYPECTOMY  11/02/2019   Procedure: POLYPECTOMY;  Surgeon: Daneil Dolin, MD;  [facility];  Service: Endoscopy;;  cecal,rectal    Family History  Problem Relation Age of Onset  . Colon cancer Mother        early 65s  . Colon cancer Maternal Aunt    Social  History   Tobacco Use  . Smoking status: Never Smoker  . Smokeless tobacco: Never Used  . Tobacco comment: Never smoked  Substance Use Topics  . Alcohol use: No    Alcohol/week: 0.0 standard drinks  . Drug use: No    Allergies  Allergen Reactions  . Iron Rash  . Latex Rash    Current Meds  Medication Sig  . aspirin EC 81 MG tablet Take 81 mg by mouth daily.   . calcium carbonate (OSCAL) 1500 (600 Ca) MG TABS tablet Take 600 mg of elemental calcium by mouth at bedtime.  . Cholecalciferol (VITAMIN D) 50 MCG (2000 UT) CAPS Take 2,000 Units by mouth at bedtime.   . hydrochlorothiazide (HYDRODIURIL) 25 MG tablet Take 25 mg by mouth daily.   Marland Kitchen ibuprofen (ADVIL,MOTRIN) 200 MG tablet Take 200 mg by mouth as needed (for pain.).   Marland Kitchen levothyroxine (SYNTHROID) 25 MCG tablet Take 25 mcg by mouth daily.   . Magnesium 400 MG CAPS Take 400 mg by mouth daily.   . Multiple Vitamin (MULTIVITAMIN) tablet Take 1 tablet by mouth daily. 50 +  . Omega-3 Fatty Acids (FISH OIL) 1000 MG CPDR Take 2,000 mcg by mouth every evening.   . Probiotic Product (PROBIOTIC PO) Take by mouth daily.  . simvastatin (ZOCOR) 20 MG tablet Take 20 mg by mouth at bedtime.   . vitamin C (ASCORBIC ACID) 500 MG tablet Take 500 mg by mouth daily.  Marland Kitchen zinc gluconate 50 MG tablet Take 50 mg by mouth at bedtime.    Objective: BP 130/76   Pulse 77   Ht 5\' 6"  (1.676 m)   Wt 162 lb (73.5 kg)   BMI 26.15 kg/m   Physical Exam:  General: Alert and oriented, no acute distress Gait: Normal  Evaluation left elbow demonstrates no deformity.  No atrophy is appreciated.  He has full and painless flexion extension about the elbow, as well as pronation and supination.  No increased laxity to varus or valgus stress.  He has tenderness palpation over the lateral epicondyle, extending distally into the extensor musculature.  Resisted extension of his long finger recreates some of his pain.  He notes some pain with resisted extension of  his wrist.  Fingers are warm and well-perfused.  Sensation is intact distally.    IMAGING: I personally ordered and reviewed the following images   X-ray of the left elbow was obtained in clinic today and demonstrates minimal degenerative changes.  No acute injuries are noted.  Well-maintained joint spaces.  Impression: Normal-appearing left elbow x-ray   New Medications:  No orders of the defined types were placed in this encounter.     Mordecai Rasmussen, MD  02/05/2021 10:29 PM

## 2021-03-25 DIAGNOSIS — Z86010 Personal history of colonic polyps: Secondary | ICD-10-CM | POA: Diagnosis not present

## 2021-03-25 DIAGNOSIS — M79602 Pain in left arm: Secondary | ICD-10-CM | POA: Diagnosis not present

## 2021-03-25 DIAGNOSIS — Z712 Person consulting for explanation of examination or test findings: Secondary | ICD-10-CM | POA: Diagnosis not present

## 2021-03-25 DIAGNOSIS — R946 Abnormal results of thyroid function studies: Secondary | ICD-10-CM | POA: Diagnosis not present

## 2021-03-25 DIAGNOSIS — G3184 Mild cognitive impairment, so stated: Secondary | ICD-10-CM | POA: Diagnosis not present

## 2021-03-25 DIAGNOSIS — L237 Allergic contact dermatitis due to plants, except food: Secondary | ICD-10-CM | POA: Diagnosis not present

## 2021-03-25 DIAGNOSIS — M79605 Pain in left leg: Secondary | ICD-10-CM | POA: Diagnosis not present

## 2021-03-25 DIAGNOSIS — L239 Allergic contact dermatitis, unspecified cause: Secondary | ICD-10-CM | POA: Diagnosis not present

## 2021-03-25 DIAGNOSIS — D509 Iron deficiency anemia, unspecified: Secondary | ICD-10-CM | POA: Diagnosis not present

## 2021-03-25 DIAGNOSIS — E782 Mixed hyperlipidemia: Secondary | ICD-10-CM | POA: Diagnosis not present

## 2021-03-25 DIAGNOSIS — Z6827 Body mass index (BMI) 27.0-27.9, adult: Secondary | ICD-10-CM | POA: Diagnosis not present

## 2021-03-25 DIAGNOSIS — L509 Urticaria, unspecified: Secondary | ICD-10-CM | POA: Diagnosis not present

## 2021-03-27 ENCOUNTER — Other Ambulatory Visit (HOSPITAL_COMMUNITY): Payer: Self-pay | Admitting: Family Medicine

## 2021-03-27 DIAGNOSIS — D649 Anemia, unspecified: Secondary | ICD-10-CM | POA: Diagnosis not present

## 2021-03-27 DIAGNOSIS — R52 Pain, unspecified: Secondary | ICD-10-CM | POA: Diagnosis not present

## 2021-03-27 DIAGNOSIS — M542 Cervicalgia: Secondary | ICD-10-CM

## 2021-03-27 DIAGNOSIS — I10 Essential (primary) hypertension: Secondary | ICD-10-CM | POA: Diagnosis not present

## 2021-03-27 DIAGNOSIS — R946 Abnormal results of thyroid function studies: Secondary | ICD-10-CM | POA: Diagnosis not present

## 2021-03-27 DIAGNOSIS — G3184 Mild cognitive impairment, so stated: Secondary | ICD-10-CM | POA: Diagnosis not present

## 2021-03-27 DIAGNOSIS — E782 Mixed hyperlipidemia: Secondary | ICD-10-CM | POA: Diagnosis not present

## 2021-04-02 DIAGNOSIS — L237 Allergic contact dermatitis due to plants, except food: Secondary | ICD-10-CM | POA: Diagnosis not present

## 2021-05-01 ENCOUNTER — Other Ambulatory Visit (HOSPITAL_COMMUNITY): Payer: Self-pay | Admitting: *Deleted

## 2021-05-01 ENCOUNTER — Other Ambulatory Visit: Payer: Self-pay

## 2021-05-01 ENCOUNTER — Other Ambulatory Visit (HOSPITAL_COMMUNITY): Payer: Self-pay | Admitting: Student

## 2021-05-01 ENCOUNTER — Ambulatory Visit (HOSPITAL_COMMUNITY)
Admission: RE | Admit: 2021-05-01 | Discharge: 2021-05-01 | Disposition: A | Discharge status: Home / Self Care | Payer: PPO | Source: Ambulatory Visit | Attending: Student | Admitting: Student

## 2021-05-01 DIAGNOSIS — M542 Cervicalgia: Secondary | ICD-10-CM | POA: Insufficient documentation

## 2021-05-01 DIAGNOSIS — M2578 Osteophyte, vertebrae: Secondary | ICD-10-CM | POA: Diagnosis not present

## 2021-05-01 DIAGNOSIS — M47812 Spondylosis without myelopathy or radiculopathy, cervical region: Secondary | ICD-10-CM | POA: Diagnosis not present

## 2021-05-02 DIAGNOSIS — Z23 Encounter for immunization: Secondary | ICD-10-CM | POA: Diagnosis not present

## 2021-06-11 ENCOUNTER — Other Ambulatory Visit (HOSPITAL_COMMUNITY): Payer: Self-pay | Admitting: Student

## 2021-06-11 ENCOUNTER — Other Ambulatory Visit: Payer: Self-pay | Admitting: Student

## 2021-06-11 DIAGNOSIS — L299 Pruritus, unspecified: Secondary | ICD-10-CM | POA: Diagnosis not present

## 2021-06-11 DIAGNOSIS — M542 Cervicalgia: Secondary | ICD-10-CM

## 2021-06-21 ENCOUNTER — Ambulatory Visit (HOSPITAL_COMMUNITY)
Admission: RE | Admit: 2021-06-21 | Discharge: 2021-06-21 | Disposition: A | Discharge status: Home / Self Care | Payer: PPO | Source: Ambulatory Visit | Attending: Student | Admitting: Student

## 2021-06-21 ENCOUNTER — Other Ambulatory Visit: Payer: Self-pay

## 2021-06-21 DIAGNOSIS — M542 Cervicalgia: Secondary | ICD-10-CM

## 2021-06-25 DIAGNOSIS — M4722 Other spondylosis with radiculopathy, cervical region: Secondary | ICD-10-CM | POA: Diagnosis not present

## 2021-06-25 DIAGNOSIS — Z6826 Body mass index (BMI) 26.0-26.9, adult: Secondary | ICD-10-CM | POA: Diagnosis not present

## 2021-06-25 DIAGNOSIS — I10 Essential (primary) hypertension: Secondary | ICD-10-CM | POA: Diagnosis not present

## 2021-07-25 ENCOUNTER — Encounter: Payer: Self-pay | Admitting: Internal Medicine

## 2021-09-19 DIAGNOSIS — I10 Essential (primary) hypertension: Secondary | ICD-10-CM | POA: Diagnosis not present

## 2021-09-25 DIAGNOSIS — E782 Mixed hyperlipidemia: Secondary | ICD-10-CM | POA: Diagnosis not present

## 2021-09-25 DIAGNOSIS — J309 Allergic rhinitis, unspecified: Secondary | ICD-10-CM | POA: Diagnosis not present

## 2021-09-25 DIAGNOSIS — M1990 Unspecified osteoarthritis, unspecified site: Secondary | ICD-10-CM | POA: Diagnosis not present

## 2021-09-25 DIAGNOSIS — I10 Essential (primary) hypertension: Secondary | ICD-10-CM | POA: Diagnosis not present

## 2021-09-25 DIAGNOSIS — R350 Frequency of micturition: Secondary | ICD-10-CM | POA: Diagnosis not present

## 2021-09-25 DIAGNOSIS — E039 Hypothyroidism, unspecified: Secondary | ICD-10-CM | POA: Diagnosis not present

## 2022-04-01 IMAGING — MR MR CERVICAL SPINE W/O CM
5 series · 40 of 48 positions shown · non-contrast
Comparison: None.

CLINICAL DATA: Left-sided neck pain with left arm numbness.
Symptoms for 3 months.

EXAM:
MRI CERVICAL SPINE WITHOUT CONTRAST
TECHNIQUE: Multiplanar, multisequence MR imaging of the cervical spine was
performed. No intravenous contrast was administered.

[Series 5: T2 · sagittal · 3.0mm · 0.69mm/px · 6 of 15 slices shown (1 of 2)]
[im 1/15]
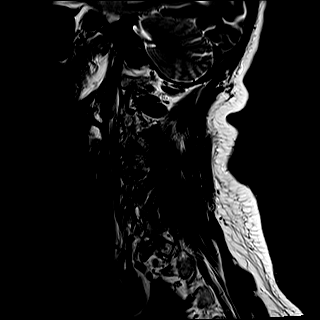
[im 3/15]
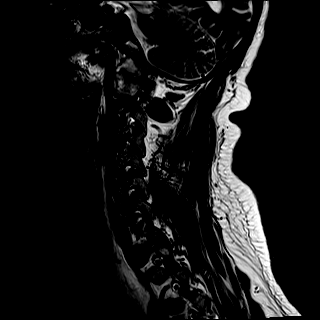
[im 6/15]
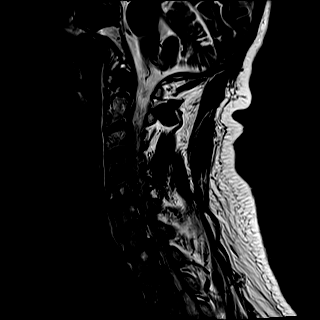
[im 9/15]
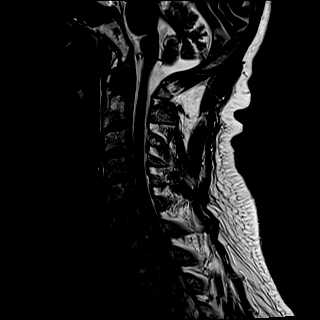
[im 12/15]
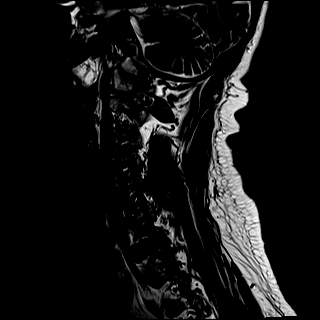
[im 15/15]
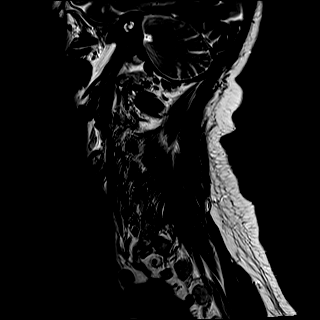

[Series 6: T1 · sagittal · 3.0mm · 0.86mm/px · 6 of 15 slices shown]
[im 1/15]
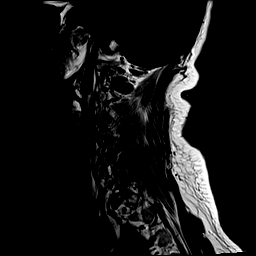
[im 3/15]
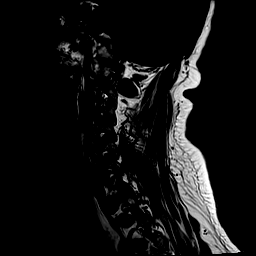
[im 6/15]
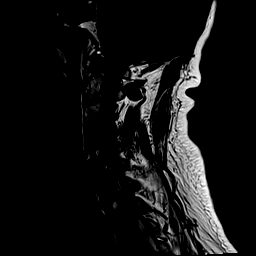
[im 9/15]
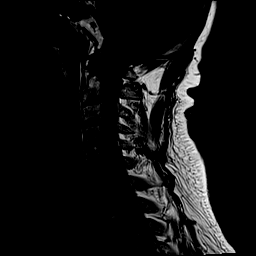
[im 12/15]
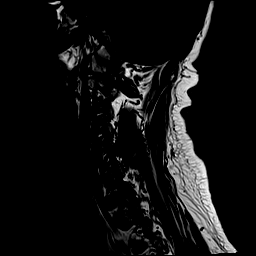
[im 15/15]
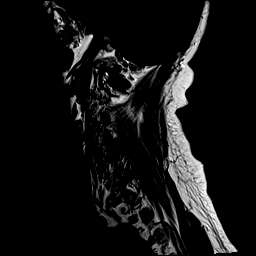

[Series 7: STIR · sagittal · 3.0mm · 0.69mm/px · 6 of 15 slices shown]
[im 1/15]
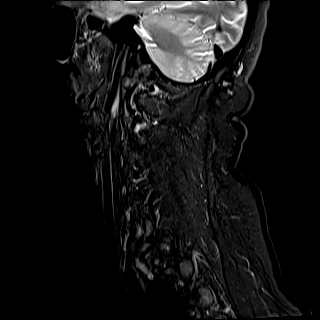
[im 3/15]
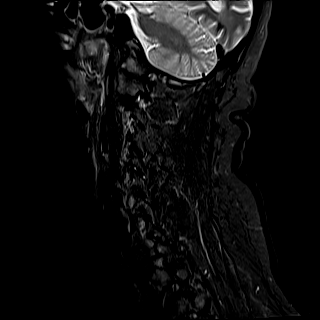
[im 6/15]
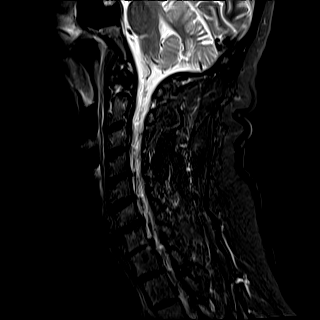
[im 9/15]
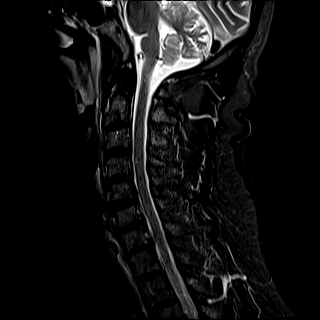
[im 12/15]
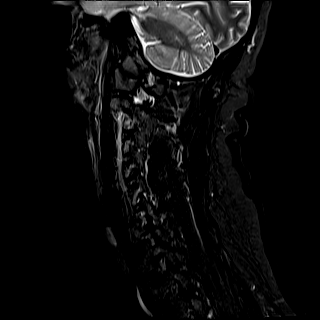
[im 15/15]
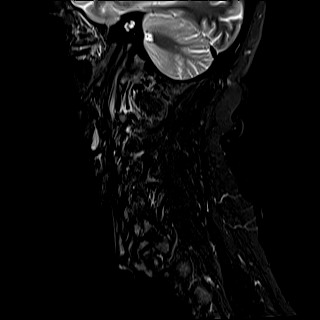

[Series 8: T2 · axial · 3.0mm · 0.70mm/px · z∈[-47,+78]mm · 14 of 40 slices shown (2 of 2)]
[im 1/40]
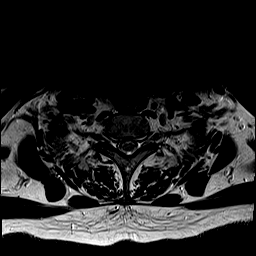
[im 3/40]
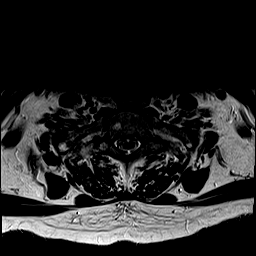
[im 6/40]
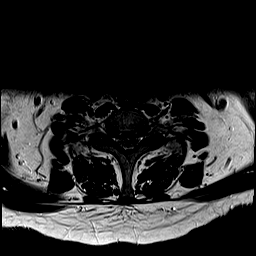
[im 9/40]
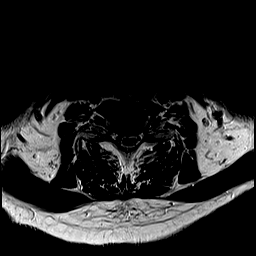
[im 12/40]
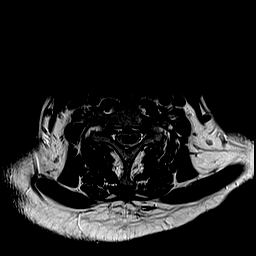
[im 14/40]
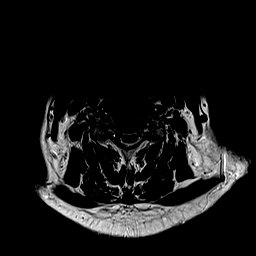
[im 17/40]
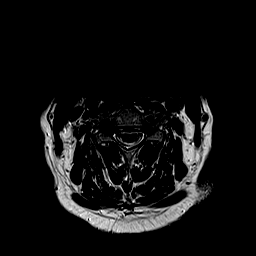
[im 20/40]
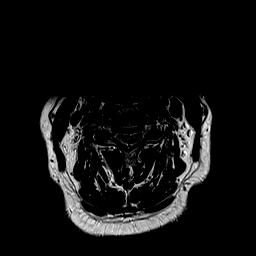
[im 23/40]
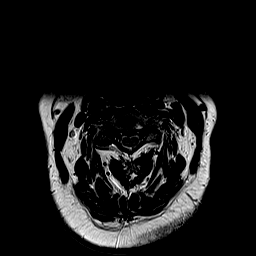
[im 26/40]
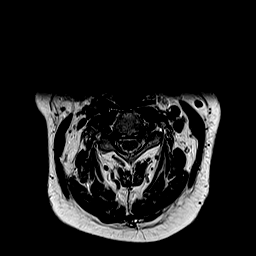
[im 28/40]
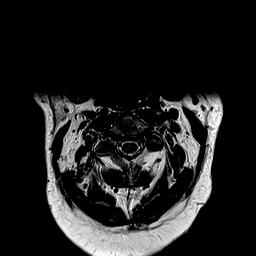
[im 31/40]
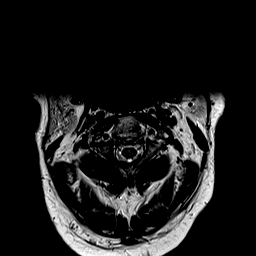
[im 34/40]
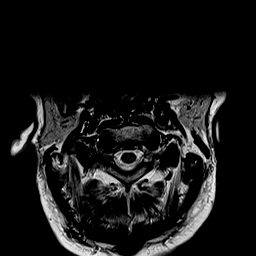
[im 40/40]
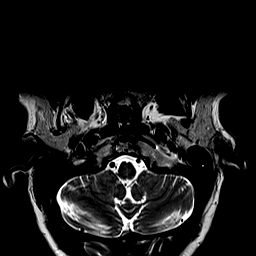

[Series 9: GRE · axial · 3.0mm · 0.35mm/px · z∈[-47,+78]mm · 8 of 40 slices shown]
[im 1/40]
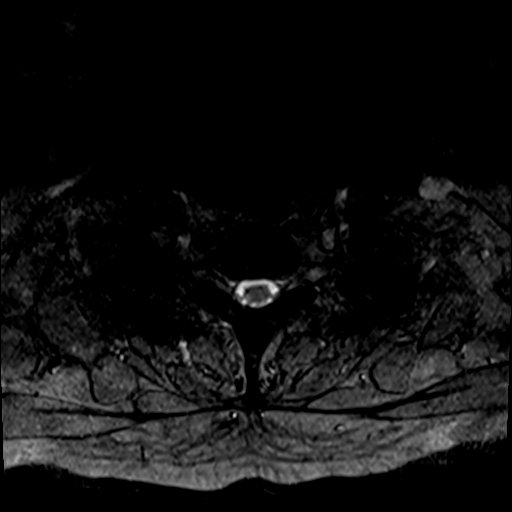
[im 6/40]
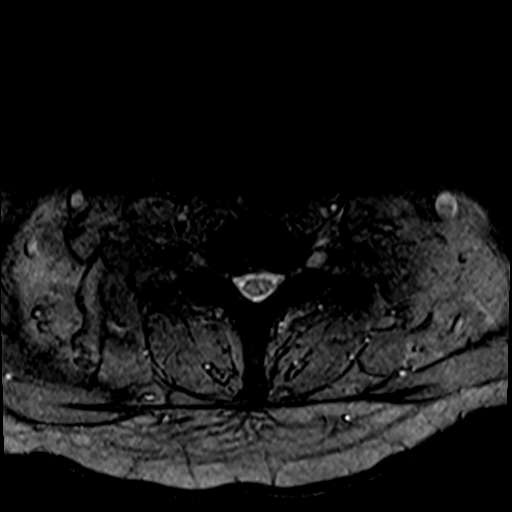
[im 12/40]
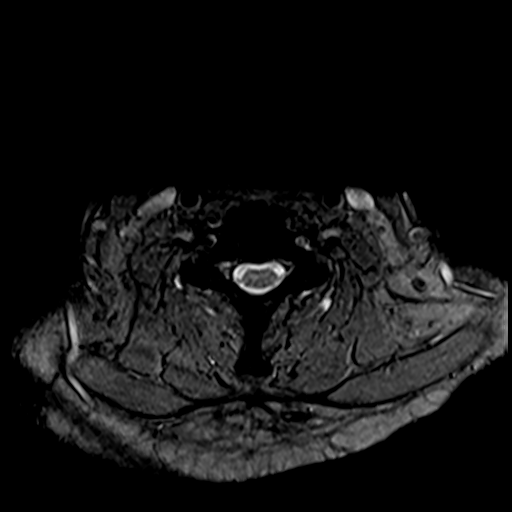
[im 17/40]
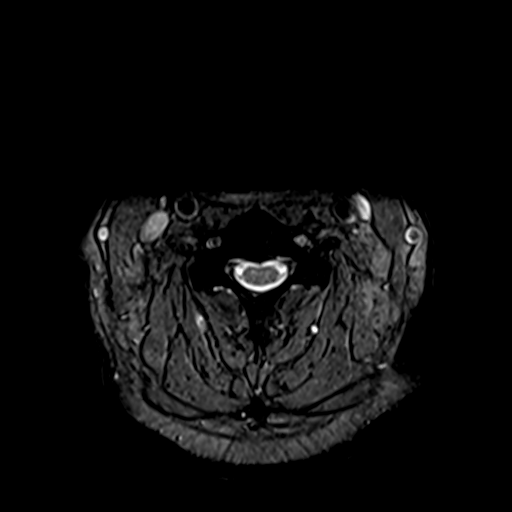
[im 23/40]
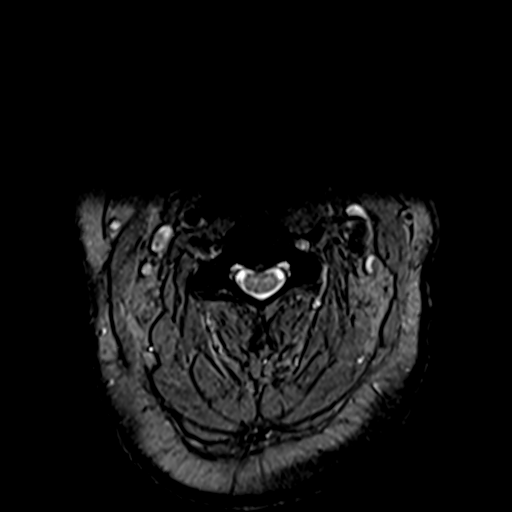
[im 28/40]
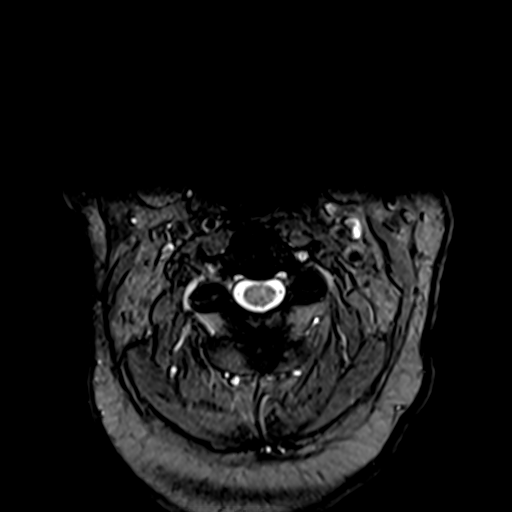
[im 34/40]
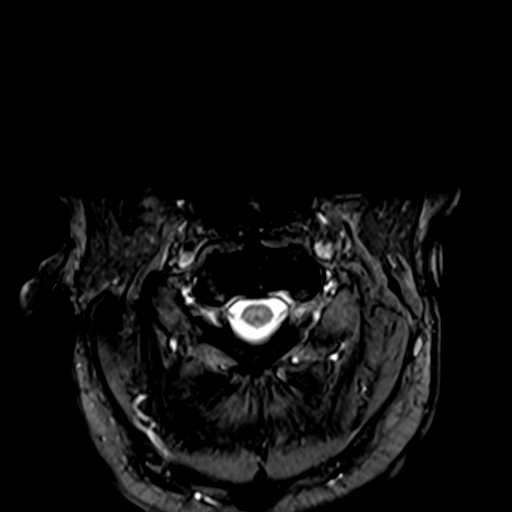
[im 40/40]
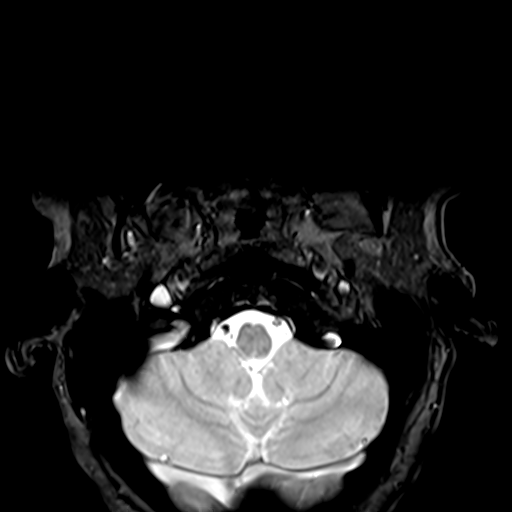

[40 of 48 positions shown; findings below may reference images not displayed]

FINDINGS: Alignment: Physiologic.

Vertebrae: No acute fracture, evidence of discitis, or bone lesion.

Cord: Normal signal and morphology.

Posterior Fossa, vertebral arteries, paraspinal tissues: Posterior
fossa demonstrates no focal abnormality. Vertebral artery flow voids
are maintained. Paraspinal soft tissues are unremarkable.

Disc levels:

Discs: Mild degenerative disease with disc height loss at C4-5 and
C6-7.

C2-3: No significant disc bulge. No neural foraminal stenosis. No
central canal stenosis.

C3-4: Small central disc protrusion. No foraminal or central canal
stenosis.

C4-5: Minimal broad-based disc bulge. Mild right foraminal
narrowing. No left foraminal narrowing. No central canal narrowing.

C5-6: Mild broad-based disc bulge with a small broad central disc
protrusion. No foraminal or central canal stenosis.

C6-7: Mild broad-based disc bulge. Mild bilateral foraminal
stenosis. No central canal stenosis.

C7-T1: Mild broad-based disc bulge. Bilateral uncovertebral
degenerative changes. Moderate bilateral foraminal stenosis.
IMPRESSION: 1. Cervical spine spondylosis as described above.
2. No acute osseous injury of the cervical spine.

## 2022-04-11 ENCOUNTER — Other Ambulatory Visit: Payer: Self-pay

## 2022-04-11 ENCOUNTER — Ambulatory Visit (INDEPENDENT_AMBULATORY_CARE_PROVIDER_SITE_OTHER): Payer: PPO

## 2022-04-11 ENCOUNTER — Ambulatory Visit
Admission: EM | Admit: 2022-04-11 | Discharge: 2022-04-11 | Disposition: A | Discharge status: Home / Self Care | Payer: PPO | Attending: Emergency Medicine | Admitting: Emergency Medicine

## 2022-04-11 ENCOUNTER — Encounter: Payer: Self-pay | Admitting: Emergency Medicine

## 2022-04-11 DIAGNOSIS — R509 Fever, unspecified: Secondary | ICD-10-CM

## 2022-04-11 DIAGNOSIS — R059 Cough, unspecified: Secondary | ICD-10-CM

## 2022-04-11 DIAGNOSIS — R051 Acute cough: Secondary | ICD-10-CM

## 2022-04-11 DIAGNOSIS — J019 Acute sinusitis, unspecified: Secondary | ICD-10-CM

## 2022-04-11 MED ORDER — PROMETHAZINE-DM 6.25-15 MG/5ML PO SYRP: 5.0000 mL | ORAL_SOLUTION | Freq: Four times a day (QID) | ORAL | 0 refills | Status: DC | PRN

## 2022-04-11 MED ORDER — IBUPROFEN 600 MG PO TABS: 600.0000 mg | ORAL_TABLET | Freq: Four times a day (QID) | ORAL | 0 refills | Status: AC | PRN

## 2022-04-11 MED ORDER — DOXYCYCLINE HYCLATE 100 MG PO CAPS: 100.0000 mg | ORAL_CAPSULE | Freq: Two times a day (BID) | ORAL | 0 refills | Status: AC

## 2022-04-11 MED ORDER — FLUTICASONE PROPIONATE 50 MCG/ACT NA SUSP: 2.0000 | Freq: Every day | NASAL | 0 refills | Status: DC

## 2022-04-11 NOTE — ED Provider Notes (Addendum)
HPI  SUBJECTIVE:  Fernando Fisher. is a 66 y.o. male who presents with fevers to 104, chills, cough productive of green sputum, sinus pain and pressure, nasal congestion, clear,  rhinorrhea, sore throat, postnasal drip, wheezing, chest congestion starting yesterday.  No facial swelling, upper dental pain, body aches, headaches, nausea, abdominal pain, shortness of breath.  He reports 1 episode of diarrhea today.  No known COVID or flu exposure.  He had 3 doses of the COVID-vaccine and this years flu vaccine.  He tried ibuprofen, Mucinex, Alka-Seltzer day and night with improvement in his symptoms.  Last dose of ibuprofen was within 6 hours of evaluation.  Symptoms worse with coughing.  No antibiotics in the past 3 months.  Patient has a past medical history of hypertension, and thyroid disease.  No history of diabetes, pulmonary disease, smoking, COVID.     Past Medical History:  Diagnosis Date   GERD (gastroesophageal reflux disease)    Hypercholesterolemia    Hypertension     Past Surgical History:  Procedure Laterality Date   CHOLECYSTECTOMY     COLONOSCOPY N/A 04/02/2016   RMR: diverticulosis sigmoid colon, six 5mm polyps removed, 28mm polyp in desc colon removed piecemeal using hot snare. tattooed. TCS 10/2016. tubular adenomas   COLONOSCOPY N/A 10/01/2016   Procedure: COLONOSCOPY;  Surgeon: Corbin Ade, MD;  [facility]: [facility];  Service: Endoscopy;  Laterality: N/A;  730   COLONOSCOPY N/A 11/02/2019   Procedure: COLONOSCOPY;  Surgeon: Corbin Ade, MD;  [facility];  Service: Endoscopy;  Laterality: N/A;  8:30am   ESOPHAGOGASTRODUODENOSCOPY N/A 06/18/2016   Procedure: ESOPHAGOGASTRODUODENOSCOPY (EGD);  Surgeon: Corbin Ade, MD;  [facility];  Service: Endoscopy;  Laterality: N/A;  745   FOOT SURGERY     GIVENS CAPSULE STUDY N/A 06/27/2016   Procedure: GIVENS CAPSULE STUDY;  Surgeon: Corbin Ade, MD;  [facility];  Service: Endoscopy;   Laterality: N/A;  0700   POLYPECTOMY  11/02/2019   Procedure: POLYPECTOMY;  Surgeon: Corbin Ade, MD;  [facility]: [facility];  Service: Endoscopy;;  cecal,rectal    Family History  Problem Relation Age of Onset   Colon cancer Mother        early 86s   Colon cancer Maternal Aunt     Social History   Tobacco Use   Smoking status: Never   Smokeless tobacco: Never   Tobacco comments:    Never smoked  Substance Use Topics   Alcohol use: No    Alcohol/week: 0.0 standard drinks   Drug use: No    No current facility-administered medications for this encounter.  Current Outpatient Medications:    doxycycline (VIBRAMYCIN) 100 MG capsule, Take 1 capsule (100 mg total) by mouth 2 (two) times daily for 10 days., Disp: 20 capsule, Rfl: 0   fluticasone (FLONASE) 50 MCG/ACT nasal spray, Place 2 sprays into both nostrils daily., Disp: 16 g, Rfl: 0   ibuprofen (ADVIL) 600 MG tablet, Take 1 tablet (600 mg total) by mouth every 6 (six) hours as needed., Disp: 30 tablet, Rfl: 0   promethazine-dextromethorphan (PROMETHAZINE-DM) 6.25-15 MG/5ML syrup, Take 5 mLs by mouth 4 (four) times daily as needed for cough., Disp: 118 mL, Rfl: 0   aspirin EC 81 MG tablet, Take 81 mg by mouth daily. , Disp: , Rfl:    calcium carbonate (OSCAL) 1500 (600 Ca) MG TABS tablet, Take 600 mg of elemental calcium by mouth at bedtime., Disp: ,  Rfl:    Cholecalciferol (VITAMIN D) 50 MCG (2000 UT) CAPS, Take 2,000 Units by mouth at bedtime. , Disp: , Rfl:    hydrochlorothiazide (HYDRODIURIL) 25 MG tablet, Take 25 mg by mouth daily. , Disp: , Rfl:    levothyroxine (SYNTHROID) 25 MCG tablet, Take 25 mcg by mouth daily. , Disp: , Rfl:    Magnesium 400 MG CAPS, Take 400 mg by mouth daily. , Disp: , Rfl:    Multiple Vitamin (MULTIVITAMIN) tablet, Take 1 tablet by mouth daily. 50 +, Disp: , Rfl:    Omega-3 Fatty Acids (FISH OIL) 1000 MG CPDR, Take 2,000 mcg by mouth every evening. , Disp: , Rfl:    Probiotic Product  (PROBIOTIC PO), Take by mouth daily., Disp: , Rfl:    simvastatin (ZOCOR) 20 MG tablet, Take 20 mg by mouth at bedtime. , Disp: , Rfl:    vitamin C (ASCORBIC ACID) 500 MG tablet, Take 500 mg by mouth daily., Disp: , Rfl:    zinc gluconate 50 MG tablet, Take 50 mg by mouth at bedtime., Disp: , Rfl:   Allergies  Allergen Reactions   Iron Rash   Latex Rash     ROS  As noted in HPI.   Physical Exam  BP (!) 147/90 (BP Location: Right Arm)   Pulse 99   Temp 98.8 F (37.1 C) (Oral)   Resp 20   Ht 5\' 6"  (1.676 m)   Wt 69.4 kg   SpO2 96%   BMI 24.69 kg/m   Constitutional: Well developed, well nourished, no acute distress.  Coughing.  Eyes:  EOMI, conjunctiva normal bilaterally HENT: Normocephalic, atraumatic,mucus membranes moist.  Positive nasal congestion.  Normal turbinates.  No maxillary, frontal sinus tenderness.  Slightly erythematous oropharynx.  Normal tonsils without exudates.  Uvula midline. Neck: No appreciable cervical lymphadenopathy Respiratory: Normal inspiratory effort, crackles left lower lobe.  No anterior, lateral chest wall tenderness. Cardiovascular: Normal rate, regular rhythm no murmurs rubs or gallops GI: nondistended skin: No rash, skin intact Musculoskeletal: no deformities Neurologic: Alert & oriented x 3, no focal neuro deficits Psychiatric: Speech and behavior appropriate   ED Course   Medications - No data to display  Orders Placed This Encounter  Procedures   Coronavirus (COVID-19) with Influenza A and Influenza B    Standing Status:   Standing    Number of Occurrences:   1   DG Chest 2 View    Standing Status:   Standing    Number of Occurrences:   1    Order Specific Question:   Reason for Exam (SYMPTOM  OR DIAGNOSIS REQUIRED)    Answer:   Fever, cough rule out pneumonia   No results found for this or any previous visit (from the past 24 hour(s)). DG Chest 2 View  Result Date: 04/11/2022 CLINICAL DATA:  Fever, cough EXAM: CHEST - 2  VIEW COMPARISON:  01/20/2016 FINDINGS: Cardiac size is within normal limits. Lung fields are clear of any pulmonary edema or focal consolidation. There is no pleural effusion or pneumothorax. Surgical clips are seen in gallbladder fossa. No significant interval changes are noted in the lung fields except for differences in the techniques. IMPRESSION: There are no signs of pulmonary edema or focal pulmonary consolidation. There is no pleural effusion. Electronically Signed   By: Ernie Avena M.D.   On: 04/11/2022 20:35    ED Clinical Impression  1. Fever, unspecified fever cause   2. Acute cough   3. Acute non-recurrent sinusitis,  unspecified location      ED Assessment/Plan  Checking COVID, flu, chest x-ray due to fever of 104 and cough, crackles left lower lobe.  Reviewed imaging independently.  No pneumonia.  See radiology report for full details.  There is no pneumonia on x-ray, however given the fever 104, chills, chest congestion, coughing and wheezing, concern for pneumonia.  Sending home with doxycycline for 10 days which will cover a pneumonia in addition to a sinusitis..  Flonase, Promethazine DM,  ibuprofen and Tylenol . work note.  COVID, flu pending at the time of initial signing of this note.  We will prescribe the appropriate antiviral if COVID or flu is positive.  Discussed labs, imaging, MDM, treatment plan, and plan for follow-up with patient. Discussed sn/sx that should prompt return to the ED. patient agrees with plan.   Meds ordered this encounter  Medications   doxycycline (VIBRAMYCIN) 100 MG capsule    Sig: Take 1 capsule (100 mg total) by mouth 2 (two) times daily for 10 days.    Dispense:  20 capsule    Refill:  0   fluticasone (FLONASE) 50 MCG/ACT nasal spray    Sig: Place 2 sprays into both nostrils daily.    Dispense:  16 g    Refill:  0   ibuprofen (ADVIL) 600 MG tablet    Sig: Take 1 tablet (600 mg total) by mouth every 6 (six) hours as needed.     Dispense:  30 tablet    Refill:  0   promethazine-dextromethorphan (PROMETHAZINE-DM) 6.25-15 MG/5ML syrup    Sig: Take 5 mLs by mouth 4 (four) times daily as needed for cough.    Dispense:  118 mL    Refill:  0      *This clinic note was created using Scientist, clinical (histocompatibility and immunogenetics). Therefore, there may be occasional mistakes despite careful proofreading.  ?    Domenick Gong, MD 04/13/22 1107    Domenick Gong, MD 04/13/22 1108

## 2022-04-11 NOTE — Discharge Instructions (Addendum)
A chest x-ray was negative for pneumonia, but I am concerned that with your fever and the crackles that I heard, and that you do have a pneumonia that is just not showing up on x-ray.  You could also have a sinusitis with the sinus pain and pressure.  I am going to send you home with doxycycline for 10 days which will cover both.  Flonase, saline nasal irrigation with a Milta Deiters Med rinse and distilled water as often as you want to wash the infection out.  Promethazine DM for cough.  600 mg of ibuprofen, 1000 mg of Tylenol together 3-4 times a day as needed for pain, fever. ?

## 2022-04-11 NOTE — ED Triage Notes (Signed)
Pt reports intermittent fever, productive cough with phlegm,sinus pressure, and drainage since yesterday. Last dose ibuprofen approximately 1530. Pt reports has been trying alka selzter gel and mucinex with some improvement of symptoms.  ?

## 2022-04-13 LAB — COVID-19, FLU A+B NAA
Influenza A, NAA: NOT DETECTED
Influenza B, NAA: NOT DETECTED
SARS-CoV-2, NAA: NOT DETECTED

## 2022-04-21 DIAGNOSIS — J069 Acute upper respiratory infection, unspecified: Secondary | ICD-10-CM | POA: Diagnosis not present

## 2022-05-05 DIAGNOSIS — I10 Essential (primary) hypertension: Secondary | ICD-10-CM | POA: Diagnosis not present

## 2022-05-05 DIAGNOSIS — E039 Hypothyroidism, unspecified: Secondary | ICD-10-CM | POA: Diagnosis not present

## 2022-05-05 DIAGNOSIS — Z125 Encounter for screening for malignant neoplasm of prostate: Secondary | ICD-10-CM | POA: Diagnosis not present

## 2022-05-13 DIAGNOSIS — Z125 Encounter for screening for malignant neoplasm of prostate: Secondary | ICD-10-CM | POA: Diagnosis not present

## 2022-05-13 DIAGNOSIS — E782 Mixed hyperlipidemia: Secondary | ICD-10-CM | POA: Diagnosis not present

## 2022-05-13 DIAGNOSIS — R7301 Impaired fasting glucose: Secondary | ICD-10-CM | POA: Diagnosis not present

## 2022-05-13 DIAGNOSIS — E039 Hypothyroidism, unspecified: Secondary | ICD-10-CM | POA: Diagnosis not present

## 2022-05-13 DIAGNOSIS — J309 Allergic rhinitis, unspecified: Secondary | ICD-10-CM | POA: Diagnosis not present

## 2022-05-13 DIAGNOSIS — M1990 Unspecified osteoarthritis, unspecified site: Secondary | ICD-10-CM | POA: Diagnosis not present

## 2022-05-13 DIAGNOSIS — I10 Essential (primary) hypertension: Secondary | ICD-10-CM | POA: Diagnosis not present

## 2022-06-10 DIAGNOSIS — E039 Hypothyroidism, unspecified: Secondary | ICD-10-CM | POA: Diagnosis not present

## 2022-07-14 DIAGNOSIS — M7671 Peroneal tendinitis, right leg: Secondary | ICD-10-CM | POA: Diagnosis not present

## 2022-07-14 DIAGNOSIS — M898X7 Other specified disorders of bone, ankle and foot: Secondary | ICD-10-CM | POA: Diagnosis not present

## 2022-07-14 DIAGNOSIS — M79671 Pain in right foot: Secondary | ICD-10-CM | POA: Diagnosis not present

## 2022-08-11 DIAGNOSIS — M79671 Pain in right foot: Secondary | ICD-10-CM | POA: Diagnosis not present

## 2022-08-11 DIAGNOSIS — M7671 Peroneal tendinitis, right leg: Secondary | ICD-10-CM | POA: Diagnosis not present

## 2022-08-11 DIAGNOSIS — M898X7 Other specified disorders of bone, ankle and foot: Secondary | ICD-10-CM | POA: Diagnosis not present

## 2022-09-22 ENCOUNTER — Telehealth: Payer: Self-pay | Admitting: Internal Medicine

## 2022-09-22 NOTE — Telephone Encounter (Signed)
Well when Fernando Fisher saw him last he told him to return in 1 year regardless for follow up. So looks like he is due for follow up anyways

## 2022-09-22 NOTE — Telephone Encounter (Signed)
Pt is on the recall for a 3 year colonoscopy. The wife came to front window saying she has skin cancer under her eye and will be having surgery in December. She is wanting husband to have his colonoscopy in [place] before she has surgery. I told her that he would be getting a questionnaire to complete and get back to [place], but she said he was a high risk and would probably need an OV prior. Please advise if I can make him an OV or do we need to wait on questionnaire and if he needs questionnaire can we send it out now.

## 2022-09-22 NOTE — Telephone Encounter (Signed)
Pt aware of OV with RMR tomorrow

## 2022-09-22 NOTE — Telephone Encounter (Signed)
TCS will have to be scheduled after 11/01/22, as some insurances will not cover if done prior to recommendation  by provider

## 2022-09-23 ENCOUNTER — Ambulatory Visit (INDEPENDENT_AMBULATORY_CARE_PROVIDER_SITE_OTHER): Payer: PPO | Admitting: Internal Medicine

## 2022-09-23 ENCOUNTER — Encounter: Payer: Self-pay | Admitting: Internal Medicine

## 2022-09-23 VITALS — BP 136/77 | HR 84 | Temp 98.3°F | Ht 66.0 in | Wt 160.2 lb

## 2022-09-23 DIAGNOSIS — Z86010 Personal history of colonic polyps: Secondary | ICD-10-CM | POA: Diagnosis not present

## 2022-09-23 NOTE — Progress Notes (Signed)
Primary Care Physician:  Celene Squibb, MD Primary Gastroenterologist:  Dr. Gala Romney  Pre-Procedure History & Physical: HPI:  Fernando Fisher. is a 66 y.o. male here to discuss setting up a surveillance colonoscopy.  History of 11 mm polyp (adenoma) removed from his colon back in 2020.  Clinically, he has done well.  Noted to have sigmoid diverticulosis as well on prior TCS.  Clinically, doing well without any lower GI tract symptoms.  He is interested in having his colonoscopy the first of the year.   Past Medical History:  Diagnosis Date   GERD (gastroesophageal reflux disease)    Hypercholesterolemia    Hypertension     Past Surgical History:  Procedure Laterality Date   CHOLECYSTECTOMY     COLONOSCOPY N/A 04/02/2016   RMR: diverticulosis sigmoid colon, six 2m polyps removed, 265mpolyp in desc colon removed piecemeal using hot snare. tattooed. TCS 10/2016. tubular adenomas   COLONOSCOPY N/A 10/01/2016   Procedure: COLONOSCOPY;  Surgeon: RoDaneil DolinMD;  [facility];  Service: Endoscopy;  Laterality: N/A;  730   COLONOSCOPY N/A 11/02/2019   Procedure: COLONOSCOPY;  Surgeon: RoDaneil DolinMD;  [facility];  Service: Endoscopy;  Laterality: N/A;  8:30am   ESOPHAGOGASTRODUODENOSCOPY N/A 06/18/2016   Procedure: ESOPHAGOGASTRODUODENOSCOPY (EGD);  Surgeon: RoDaneil DolinMD;  [facility];  Service: Endoscopy;  Laterality: N/A;  745   FOOT SURGERY     GIVENS CAPSULE STUDY N/A 06/27/2016   Procedure: GIVENS CAPSULE STUDY;  Surgeon: RoDaneil DolinMD;  [facility];  Service: Endoscopy;  Laterality: N/A;  0700   POLYPECTOMY  11/02/2019   Procedure: POLYPECTOMY;  Surgeon: RoDaneil DolinMD;  [facility];  Service: Endoscopy;;  cecal,rectal    Prior to Admission medications   Medication Sig Start Date End Date Taking? Authorizing Provider  aspirin EC 81 MG tablet Take 81 mg by mouth daily.    Yes [provider]   calcium carbonate (OSCAL) 1500 (600 Ca) MG TABS tablet Take 600 mg of elemental calcium by mouth at bedtime.   Yes [provider]  Cholecalciferol (VITAMIN D) 50 MCG (2000 UT) CAPS Take 2,000 Units by mouth at bedtime.    Yes [provider]  fluticasone (FLONASE) 50 MCG/ACT nasal spray Place 2 sprays into both nostrils daily. 04/11/22  Yes MoMelynda RippleMD  hydrochlorothiazide (HYDRODIURIL) 25 MG tablet Take 25 mg by mouth daily.    Yes [provider]  ibuprofen (ADVIL) 600 MG tablet Take 1 tablet (600 mg total) by mouth every 6 (six) hours as needed. 04/11/22  Yes MoMelynda RippleMD  levothyroxine (SYNTHROID) 25 MCG tablet Take 25 mcg by mouth daily.  06/24/19  Yes [provider]  Magnesium 400 MG CAPS Take 400 mg by mouth daily.    Yes [provider]  Multiple Vitamin (MULTIVITAMIN) tablet Take 1 tablet by mouth daily. 50 +   Yes [provider]  Omega-3 Fatty Acids (FISH OIL) 1000 MG CPDR Take 2,000 mcg by mouth every evening.    Yes [provider]  Probiotic Product (PROBIOTIC PO) Take by mouth daily.   Yes [provider]  simvastatin (ZOCOR) 20 MG tablet Take 20 mg by mouth at bedtime.    Yes [provider]  zinc gluconate 50 MG tablet Take 50 mg by mouth at bedtime.   Yes [provider]    Allergies as of  09/23/2022 - Review Complete 09/23/2022  Allergen Reaction Noted   Iron Rash 10/20/2019   Latex Rash 09/26/2016    Family History  Problem Relation Age of Onset   Colon cancer Mother        early 50s   Colon cancer Maternal Aunt     Social History   Socioeconomic History   Marital status: Married    Spouse name: Not on file   Number of children: Not on file   Years of education: Not on file   Highest education level: Not on file  Occupational History   Not on file  Tobacco Use   Smoking status: Never   Smokeless tobacco: Never   Tobacco comments:    Never smoked   Substance and Sexual Activity   Alcohol use: No    Alcohol/week: 0.0 standard drinks of alcohol   Drug use: No   Sexual activity: Yes  Other Topics Concern   Not on file  Social History Narrative   Not on file   Social Determinants of Health   Financial Resource Strain: Not on file  Food Insecurity: Not on file  Transportation Needs: Not on file  Physical Activity: Not on file  Stress: Not on file  Social Connections: Not on file  Intimate Partner Violence: Not on file    Review of Systems: See HPI, otherwise negative ROS  Physical Exam: BP 136/77 (BP Location: Right Arm, Patient Position: Sitting, Cuff Size: Normal)   Pulse 84   Temp 98.3 F (36.8 C) (Oral)   Ht '5\' 6"'$  (1.676 m)   Wt 160 lb 3.2 oz (72.7 kg)   SpO2 97%   BMI 25.86 kg/m  General:   Alert,  Well-developed, well-nourished, pleasant and cooperative in NAD; accompanied by his spouse. Mouth:  No deformity or lesions. Neck:  Supple; no masses or thyromegaly. No significant cervical adenopathy. Lungs:  Clear throughout to auscultation.   No wheezes, crackles, or rhonchi. No acute distress. Heart:  Regular rate and rhythm; no murmurs, clicks, rubs,  or gallops. Abdomen: Non-distended, normal bowel sounds.  Soft and nontender without appreciable mass or hepatosplenomegaly.  Pulses:  Normal pulses noted. Extremities:  Without clubbing or edema.  Impression/Plan: Pleasant 66 year old gentleman with a history of a greater than 1 cm adenoma removed from his colon 2020.  I have offered him a surveillance colonoscopy per recommendations. As discussed, we will schedule a surveillance colonoscopy (history of polyps).  In January 2024.  ASA 2.  The risks, benefits, limitations, alternatives and imponderables have been reviewed with the patient. Questions have been answered. All parties are agreeable.    Further recommendations to follow.     Notice: This dictation was prepared with Dragon dictation along with smaller  phrase technology. Any transcriptional errors that result from this process are unintentional and may not be corrected upon review.

## 2022-09-23 NOTE — Patient Instructions (Signed)
It was good to see you again today!  As discussed, we will schedule a surveillance colonoscopy (history of polyps).  In January 2024.  ASA 2.  Further recommendations to follow.

## 2022-11-06 DIAGNOSIS — R7301 Impaired fasting glucose: Secondary | ICD-10-CM | POA: Diagnosis not present

## 2022-11-06 DIAGNOSIS — E039 Hypothyroidism, unspecified: Secondary | ICD-10-CM | POA: Diagnosis not present

## 2022-11-06 DIAGNOSIS — Z125 Encounter for screening for malignant neoplasm of prostate: Secondary | ICD-10-CM | POA: Diagnosis not present

## 2022-11-06 DIAGNOSIS — I10 Essential (primary) hypertension: Secondary | ICD-10-CM | POA: Diagnosis not present

## 2022-11-12 ENCOUNTER — Encounter: Payer: Self-pay | Admitting: *Deleted

## 2022-11-12 ENCOUNTER — Telehealth: Payer: Self-pay | Admitting: *Deleted

## 2022-11-12 NOTE — Telephone Encounter (Signed)
Recall sent 

## 2022-11-12 NOTE — Telephone Encounter (Signed)
Patient on recall for TCS in January per Dr Clarisa Kindred note from October 2023

## 2022-11-14 DIAGNOSIS — Z0000 Encounter for general adult medical examination without abnormal findings: Secondary | ICD-10-CM | POA: Diagnosis not present

## 2022-11-14 DIAGNOSIS — Z125 Encounter for screening for malignant neoplasm of prostate: Secondary | ICD-10-CM | POA: Diagnosis not present

## 2022-11-14 DIAGNOSIS — J309 Allergic rhinitis, unspecified: Secondary | ICD-10-CM | POA: Diagnosis not present

## 2022-11-14 DIAGNOSIS — I10 Essential (primary) hypertension: Secondary | ICD-10-CM | POA: Diagnosis not present

## 2022-11-14 DIAGNOSIS — M519 Unspecified thoracic, thoracolumbar and lumbosacral intervertebral disc disorder: Secondary | ICD-10-CM | POA: Diagnosis not present

## 2022-11-14 DIAGNOSIS — E782 Mixed hyperlipidemia: Secondary | ICD-10-CM | POA: Diagnosis not present

## 2022-11-14 DIAGNOSIS — E039 Hypothyroidism, unspecified: Secondary | ICD-10-CM | POA: Diagnosis not present

## 2022-11-14 DIAGNOSIS — R7303 Prediabetes: Secondary | ICD-10-CM | POA: Diagnosis not present

## 2022-11-14 DIAGNOSIS — M1990 Unspecified osteoarthritis, unspecified site: Secondary | ICD-10-CM | POA: Diagnosis not present

## 2022-11-14 DIAGNOSIS — M545 Low back pain, unspecified: Secondary | ICD-10-CM | POA: Diagnosis not present

## 2022-11-14 DIAGNOSIS — Z23 Encounter for immunization: Secondary | ICD-10-CM | POA: Diagnosis not present

## 2022-12-02 ENCOUNTER — Encounter: Payer: Self-pay | Admitting: *Deleted

## 2022-12-02 DIAGNOSIS — Z86010 Personal history of colonic polyps: Secondary | ICD-10-CM

## 2022-12-02 MED ORDER — PEG 3350-KCL-NA BICARB-NACL 420 G PO SOLR: 4000.0000 mL | Freq: Once | ORAL | 0 refills | Status: AC

## 2022-12-08 ENCOUNTER — Encounter: Payer: Self-pay | Admitting: *Deleted

## 2022-12-08 ENCOUNTER — Telehealth: Payer: Self-pay | Admitting: *Deleted

## 2022-12-08 MED ORDER — CLENPIQ 10-3.5-12 MG-GM -GM/175ML PO SOLN: 1.0000 | ORAL | 0 refills | Status: AC

## 2022-12-08 NOTE — Telephone Encounter (Signed)
Pt's wife called and stated he could not do the Trilyte prep because he would vomit it back up. She requested the "2 bottle prep" be sent to [facility]. Advised pt's wife that sent in the new pre and new instructions will be sent to pt. Verbalized understanding

## 2022-12-23 ENCOUNTER — Other Ambulatory Visit (HOSPITAL_COMMUNITY)
Admission: RE | Admit: 2022-12-23 | Discharge: 2022-12-23 | Disposition: A | Discharge status: Home / Self Care | Payer: PPO | Source: Ambulatory Visit | Attending: Internal Medicine | Admitting: Internal Medicine

## 2022-12-23 DIAGNOSIS — Z86010 Personal history of colonic polyps: Secondary | ICD-10-CM | POA: Diagnosis not present

## 2022-12-23 LAB — BASIC METABOLIC PANEL
Anion gap: 9 (ref 5–15)
BUN: 16 mg/dL (ref 8–23)
CO2: 28 mmol/L (ref 22–32)
Calcium: 9.9 mg/dL (ref 8.9–10.3)
Chloride: 100 mmol/L (ref 98–111)
Creatinine, Ser: 1.02 mg/dL (ref 0.61–1.24)
GFR, Estimated: >60 mL/min (ref >60)
Glucose, Bld: 101 mg/dL — ABNORMAL HIGH (ref 70–99)
Potassium: 3.9 mmol/L (ref 3.5–5.1)
Sodium: 137 mmol/L (ref 135–145)

## 2022-12-29 ENCOUNTER — Encounter (HOSPITAL_COMMUNITY)
Admission: RE | Disposition: A | Discharge status: Home / Self Care | Payer: Self-pay | Source: Ambulatory Visit | Attending: Internal Medicine

## 2022-12-29 ENCOUNTER — Encounter (HOSPITAL_COMMUNITY): Payer: Self-pay | Admitting: Internal Medicine

## 2022-12-29 ENCOUNTER — Other Ambulatory Visit: Payer: Self-pay

## 2022-12-29 ENCOUNTER — Ambulatory Visit (HOSPITAL_COMMUNITY): Payer: PPO | Admitting: Anesthesiology

## 2022-12-29 ENCOUNTER — Ambulatory Visit (HOSPITAL_BASED_OUTPATIENT_CLINIC_OR_DEPARTMENT_OTHER): Payer: PPO | Admitting: Anesthesiology

## 2022-12-29 ENCOUNTER — Ambulatory Visit (HOSPITAL_COMMUNITY)
Admission: RE | Admit: 2022-12-29 | Discharge: 2022-12-29 | Disposition: A | Discharge status: Home / Self Care | Payer: PPO | Source: Ambulatory Visit | Attending: Internal Medicine | Admitting: Internal Medicine

## 2022-12-29 DIAGNOSIS — D120 Benign neoplasm of cecum: Secondary | ICD-10-CM

## 2022-12-29 DIAGNOSIS — D123 Benign neoplasm of transverse colon: Secondary | ICD-10-CM | POA: Diagnosis not present

## 2022-12-29 DIAGNOSIS — Z86010 Personal history of colonic polyps: Secondary | ICD-10-CM | POA: Diagnosis not present

## 2022-12-29 DIAGNOSIS — K219 Gastro-esophageal reflux disease without esophagitis: Secondary | ICD-10-CM | POA: Insufficient documentation

## 2022-12-29 DIAGNOSIS — Z09 Encounter for follow-up examination after completed treatment for conditions other than malignant neoplasm: Secondary | ICD-10-CM

## 2022-12-29 DIAGNOSIS — I10 Essential (primary) hypertension: Secondary | ICD-10-CM | POA: Insufficient documentation

## 2022-12-29 DIAGNOSIS — K5730 Diverticulosis of large intestine without perforation or abscess without bleeding: Secondary | ICD-10-CM

## 2022-12-29 DIAGNOSIS — Z1211 Encounter for screening for malignant neoplasm of colon: Secondary | ICD-10-CM | POA: Diagnosis not present

## 2022-12-29 DIAGNOSIS — E039 Hypothyroidism, unspecified: Secondary | ICD-10-CM | POA: Diagnosis not present

## 2022-12-29 HISTORY — PX: POLYPECTOMY: SHX5525

## 2022-12-29 HISTORY — PX: COLONOSCOPY WITH PROPOFOL: SHX5780

## 2022-12-29 SURGERY — COLONOSCOPY WITH PROPOFOL
Anesthesia: General

## 2022-12-29 MED ORDER — LACTATED RINGERS IV SOLN
INTRAVENOUS | Status: DC
  Administered 2022-12-29: 1000 mL via INTRAVENOUS

## 2022-12-29 MED ORDER — PROPOFOL 500 MG/50ML IV EMUL
INTRAVENOUS | Status: DC | PRN
  Administered 2022-12-29: 150 ug/kg/min via INTRAVENOUS

## 2022-12-29 MED ORDER — PROPOFOL 10 MG/ML IV BOLUS
INTRAVENOUS | Status: DC | PRN
  Administered 2022-12-29: 60 mg via INTRAVENOUS

## 2022-12-29 MED ORDER — LIDOCAINE HCL 1 % IJ SOLN
INTRAMUSCULAR | Status: DC | PRN
  Administered 2022-12-29: 50 mg via INTRADERMAL

## 2022-12-29 MED ORDER — STERILE WATER FOR IRRIGATION IR SOLN
Status: DC | PRN
  Administered 2022-12-29: .6 mL

## 2022-12-29 NOTE — Op Note (Signed)
[facility] Patient Name: Fernando Fisher Procedure Date: 12/29/2022 8:46 AM MRN: [id number] Date of Birth: Oct 14, 1956 Attending MD: Norvel Richards , MD, [id number] CSN: [id number] Age: 67 Admit Type: Outpatient Procedure:                Colonoscopy Indications:              High risk colon cancer surveillance: Personal                            history of colonic polyps Providers:                Norvel Richards, MD, Janeece Riggers, RN, Illene Labrador Referring MD:              Medicines:                Propofol per Anesthesia Complications:            No immediate complications. Estimated Blood Loss:     Estimated blood loss was minimal. Procedure:                Pre-Anesthesia Assessment:                           - Prior to the procedure, a History and Physical                            was performed, and patient medications and                            allergies were reviewed. The patient's tolerance of                            previous anesthesia was also reviewed. The risks                            and benefits of the procedure and the sedation                            options and risks were discussed with the patient.                            All questions were answered, and informed consent                            was obtained. Prior Anticoagulants: The patient has                            taken no anticoagulant or antiplatelet agents. ASA                            Grade Assessment: II - A patient with mild systemic  disease. After reviewing the risks and benefits,                            the patient was deemed in satisfactory condition to                            undergo the procedure.                           After obtaining informed consent, the colonoscope                            was passed under direct vision. Throughout the                            procedure, the patient's  blood pressure, pulse, and                            oxygen saturations were monitored continuously. The                            [id number]) scope was introduced through the                            anus and advanced to the the cecum, identified by                            appendiceal orifice and ileocecal valve. The                            colonoscopy was performed without difficulty. The                            patient tolerated the procedure well. The quality                            of the bowel preparation was adequate. The                            ileocecal valve, appendiceal orifice, and rectum                            were photographed. Scope In: 9:03:44 AM Scope Out: 9:18:46 AM Scope Withdrawal Time: 0 hours 10 minutes 29 seconds  Total Procedure Duration: 0 hours 15 minutes 2 seconds  Findings:      The perianal and digital rectal examinations were normal.      Scattered medium-mouthed diverticula were found in the sigmoid colon.       Previously placed tattoo again identified.      A 5 mm polyp was found in the ileocecal valve. The polyp was sessile.       The polyp was removed with a cold snare. Resection and retrieval were       complete. Estimated blood loss was minimal.      The exam was otherwise without abnormality on direct  and retroflexion       views. Impression:               - Diverticulosis in the sigmoid colon.                           - One 5 mm polyp at the ileocecal valve, removed                            with a cold snare. Resected and retrieved.                           - The examination was otherwise normal on direct                            and retroflexion views. Moderate Sedation:      Moderate (conscious) sedation was personally administered by an       anesthesia professional. The following parameters were monitored: oxygen       saturation, heart rate, blood pressure, respiratory rate, EKG, adequacy       of  pulmonary ventilation, and response to care. Recommendation:           - Patient has a contact number available for                            emergencies. The signs and symptoms of potential                            delayed complications were discussed with the                            patient. Return to normal activities tomorrow.                            Written discharge instructions were provided to the                            patient.                           - Advance diet as tolerated.                           - Continue present medications.                           - Repeat colonoscopy date to be determined after                            pending pathology results are reviewed for                            surveillance.                           - Return to GI office (date not yet determined). Procedure Code(s):        ---  Professional ---                           [phone], Colonoscopy, flexible; with removal of                            tumor(s), polyp(s), or other lesion(s) by snare                            technique Diagnosis Code(s):        --- Professional ---                           Z86.010, Personal history of colonic polyps                           D12.0, Benign neoplasm of cecum                           K57.30, Diverticulosis of large intestine without                            perforation or abscess without bleeding CPT copyright 2022 [employer]. All rights reserved. The codes documented in this report are preliminary and upon coder review may  be revised to meet current compliance requirements. Cristopher Estimable. Malcom Selmer, MD Norvel Richards, MD 12/29/2022 9:24:59 AM This report has been signed electronically. Number of Addenda: 0

## 2022-12-29 NOTE — H&P (Signed)
$'@LOGO'S$ @   Primary Care Physician:  Celene Squibb, MD Primary Gastroenterologist:  Dr. Gala Romney   Pre-Procedure History & Physical: HPI:  Fernando Fisher. is a 67 y.o. male here for  surveillance colonoscopy.  History of multiple colonic adenomas removed over time.  Prior large piecemeal polypectomy 11 mm polyp removed 2020.  Here for surveillance colonoscopy.  Past Medical History:  Diagnosis Date   GERD (gastroesophageal reflux disease)    Hypercholesterolemia    Hypertension     Past Surgical History:  Procedure Laterality Date   CHOLECYSTECTOMY     COLONOSCOPY N/A 04/02/2016   RMR: diverticulosis sigmoid colon, six 69m polyps removed, 291mpolyp in desc colon removed piecemeal using hot snare. tattooed. TCS 10/2016. tubular adenomas   COLONOSCOPY N/A 10/01/2016   Procedure: COLONOSCOPY;  Surgeon: RoDaneil DolinMD;  [facility];  Service: Endoscopy;  Laterality: N/A;  730   COLONOSCOPY N/A 11/02/2019   Procedure: COLONOSCOPY;  Surgeon: RoDaneil DolinMD;  [facility];  Service: Endoscopy;  Laterality: N/A;  8:30am   ESOPHAGOGASTRODUODENOSCOPY N/A 06/18/2016   Procedure: ESOPHAGOGASTRODUODENOSCOPY (EGD);  Surgeon: RoDaneil DolinMD;  [facility]: [facility];  Service: Endoscopy;  Laterality: N/A;  745   FOOT SURGERY     GIVENS CAPSULE STUDY N/A 06/27/2016   Procedure: GIVENS CAPSULE STUDY;  Surgeon: RoDaneil DolinMD;  [facility];  Service: Endoscopy;  Laterality: N/A;  0700   POLYPECTOMY  11/02/2019   Procedure: POLYPECTOMY;  Surgeon: RoDaneil DolinMD;  [facility];  Service: Endoscopy;;  cecal,rectal    Prior to Admission medications   Medication Sig Start Date End Date Taking? Authorizing Provider  aspirin EC 81 MG tablet Take 81 mg by mouth daily.    Yes [provider]  Cholecalciferol (VITAMIN D) 50 MCG (2000 UT) CAPS Take 2,000 Units by mouth at bedtime.    Yes [provider]  Cyanocobalamin (VITAMIN  B12 PO) Take 1 tablet by mouth daily.   Yes [provider]  diclofenac (VOLTAREN) 75 MG EC tablet Take 75 mg by mouth daily as needed for mild pain or moderate pain.   Yes [provider]  diclofenac Sodium (VOLTAREN ARTHRITIS PAIN) 1 % GEL Apply 2 g topically 4 (four) times daily.   Yes [provider]  fluticasone (FLONASE) 50 MCG/ACT nasal spray Place 2 sprays into both nostrils daily. Patient taking differently: Place 2 sprays into both nostrils daily as needed for rhinitis. 04/11/22  Yes MoMelynda RippleMD  hydrochlorothiazide (HYDRODIURIL) 25 MG tablet Take 25 mg by mouth daily.    Yes [provider]  ibuprofen (ADVIL) 600 MG tablet Take 1 tablet (600 mg total) by mouth every 6 (six) hours as needed. Patient taking differently: Take 600 mg by mouth every 6 (six) hours as needed for mild pain or moderate pain. 04/11/22  Yes MoMelynda RippleMD  levothyroxine (SYNTHROID) 50 MCG tablet Take 50 mcg by mouth daily. 06/24/19  Yes [provider]  methocarbamol (ROBAXIN) 750 MG tablet Take 750 mg by mouth every 6 (six) hours as needed for muscle spasms.   Yes [provider]  Multiple Vitamin (MULTIVITAMIN) tablet Take 1 tablet by mouth daily. 50 +   Yes [provider]  Omega-3 Fatty Acids (FISH OIL PO) Take 720 mg by mouth every evening.   Yes [provider]  Probiotic Product (PROBIOTIC PO) Take 1 capsule by mouth daily. Digestive advantage  Yes [provider]  simvastatin (ZOCOR) 20 MG tablet Take 20 mg by mouth at bedtime.    Yes [provider]  Sod Picosulfate-Mag Ox-Cit Acd (CLENPIQ) 10-3.5-12 MG-GM -GM/175ML SOLN Take 1 kit by mouth as directed. 12/08/22  Yes Filomena Pokorney, Cristopher Estimable, MD  zinc gluconate 50 MG tablet Take 50 mg by mouth at bedtime.   Yes [provider]    Allergies as of 12/02/2022 - Review Complete 09/23/2022  Allergen Reaction Noted   Iron Rash 10/20/2019   Latex Rash  09/26/2016    Family History  Problem Relation Age of Onset   Colon cancer Mother        early 58s   Colon cancer Maternal Aunt     Social History   Socioeconomic History   Marital status: Married    Spouse name: Not on file   Number of children: Not on file   Years of education: Not on file   Highest education level: Not on file  Occupational History   Not on file  Tobacco Use   Smoking status: Never   Smokeless tobacco: Never   Tobacco comments:    Never smoked  Substance and Sexual Activity   Alcohol use: No    Alcohol/week: 0.0 standard drinks of alcohol   Drug use: No   Sexual activity: Yes  Other Topics Concern   Not on file  Social History Narrative   Not on file   Social Determinants of Health   Financial Resource Strain: Not on file  Food Insecurity: Not on file  Transportation Needs: Not on file  Physical Activity: Not on file  Stress: Not on file  Social Connections: Not on file  Intimate Partner Violence: Not on file    Review of Systems: See HPI, otherwise negative ROS  Physical Exam: BP (!) 161/82   Pulse 74   Temp 98 F (36.7 C) (Oral)   Resp 16   Ht '5\' 6"'$  (1.676 m)   Wt 69.9 kg   SpO2 99%   BMI 24.86 kg/m  General:   Alert,  Well-developed, well-nourished, pleasant and cooperative in NAD Neck:  Supple; no masses or thyromegaly. No significant cervical adenopathy. Lungs:  Clear throughout to auscultation.   No wheezes, crackles, or rhonchi. No acute distress. Heart:  Regular rate and rhythm; no murmurs, clicks, rubs,  or gallops. Abdomen: Non-distended, normal bowel sounds.  Soft and nontender without appreciable mass or hepatosplenomegaly.  Pulses:  Normal pulses noted. Extremities:  Without clubbing or edema.  Impression/Plan:   67 year old gentleman with a history of multiple multiple colonic adenomas removed over time here for surveillance colonoscopy he currently has no GI symptoms.    I have offered him a surveillance  colonoscopy.  The risks, benefits, limitations, alternatives and imponderables have been reviewed with the patient. Questions have been answered. All parties are agreeable.       Notice: This dictation was prepared with Dragon dictation along with smaller phrase technology. Any transcriptional errors that result from this process are unintentional and may not be corrected upon review.

## 2022-12-29 NOTE — Transfer of Care (Signed)
Immediate Anesthesia Transfer of Care Note  Patient: Fernando Fisher.  Procedure(s) Performed: COLONOSCOPY WITH PROPOFOL POLYPECTOMY  Patient Location: Endoscopy Unit  Anesthesia Type:General  Level of Consciousness: awake  Airway & Oxygen Therapy: Patient Spontanous Breathing  Post-op Assessment: Report given to RN  Post vital signs: Reviewed and stable  Last Vitals:  Vitals Value Taken Time  BP    Temp    Pulse    Resp    SpO2      Last Pain:  Vitals:   12/29/22 0834  TempSrc: Oral  PainSc: 0-No pain      Patients Stated Pain Goal: 8 (43/60/67 7034)  Complications: No notable events documented.

## 2022-12-29 NOTE — Anesthesia Postprocedure Evaluation (Signed)
Anesthesia Post Note  Patient: Fernando Fisher.  Procedure(s) Performed: COLONOSCOPY WITH PROPOFOL POLYPECTOMY  Patient location during evaluation: Endoscopy Anesthesia Type: General Level of consciousness: awake Pain management: pain level controlled Vital Signs Assessment: post-procedure vital signs reviewed and stable Respiratory status: spontaneous breathing Cardiovascular status: blood pressure returned to baseline and stable Postop Assessment: no apparent nausea or vomiting Anesthetic complications: no   No notable events documented.   Last Vitals:  Vitals:   12/29/22 0834  BP: (!) 161/82  Pulse: 74  Resp: 16  Temp: 36.7 C  SpO2: 99%    Last Pain:  Vitals:   12/29/22 0834  TempSrc: Oral  PainSc: 0-No pain                 Anuoluwapo Mefferd

## 2022-12-29 NOTE — Anesthesia Preprocedure Evaluation (Addendum)
Anesthesia Evaluation  Patient identified by MRN, date of birth, ID band Patient awake    Reviewed: Allergy & Precautions, H&P , NPO status , Patient's Chart, lab work & pertinent test results  Airway Mallampati: III  TM Distance: >3 FB Neck ROM: Full    Dental no notable dental hx. (+) Teeth Intact, Dental Advisory Given   Pulmonary neg pulmonary ROS   Pulmonary exam normal breath sounds clear to auscultation       Cardiovascular Exercise Tolerance: Good hypertension, Pt. on medications Normal cardiovascular exam Rhythm:Regular Rate:Normal     Neuro/Psych negative neurological ROS  negative psych ROS   GI/Hepatic Neg liver ROS,GERD  Controlled,,  Endo/Other  Hypothyroidism    Renal/GU negative Renal ROS  negative genitourinary   Musculoskeletal negative musculoskeletal ROS (+)    Abdominal   Peds negative pediatric ROS (+)  Hematology  (+) Blood dyscrasia, anemia   Anesthesia Other Findings   Reproductive/Obstetrics negative OB ROS                              Anesthesia Physical Anesthesia Plan  ASA: 2  Anesthesia Plan: General   Post-op Pain Management: Minimal or no pain anticipated   Induction: Intravenous  PONV Risk Score and Plan: 1 and Propofol infusion  Airway Management Planned: Natural Airway and Nasal Cannula  Additional Equipment:   Intra-op Plan:   Post-operative Plan:   Informed Consent: I have reviewed the patients History and Physical, chart, labs and discussed the procedure including the risks, benefits and alternatives for the proposed anesthesia with the patient or authorized representative who has indicated his/her understanding and acceptance.     Dental advisory given  Plan Discussed with: CRNA and Surgeon  Anesthesia Plan Comments:         Anesthesia Quick Evaluation

## 2022-12-29 NOTE — Discharge Instructions (Signed)
  Colonoscopy Discharge Instructions  Read the instructions outlined below and refer to this sheet in the next few weeks. These discharge instructions provide you with general information on caring for yourself after you leave the hospital. Your doctor may also give you specific instructions. While your treatment has been planned according to the most current medical practices available, unavoidable complications occasionally occur. If you have any problems or questions after discharge, call Dr. Gala Romney at [phone]. ACTIVITY You may resume your regular activity, but move at a slower pace for the next 24 hours.  Take frequent rest periods for the next 24 hours.  Walking will help get rid of the air and reduce the bloated feeling in your belly (abdomen).  No driving for 24 hours (because of the medicine (anesthesia) used during the test).   Do not sign any important legal documents or operate any machinery for 24 hours (because of the anesthesia used during the test).  NUTRITION Drink plenty of fluids.  You may resume your normal diet as instructed by your doctor.  Begin with a light meal and progress to your normal diet. Heavy or fried foods are harder to digest and may make you feel sick to your stomach (nauseated).  Avoid alcoholic beverages for 24 hours or as instructed.  MEDICATIONS You may resume your normal medications unless your doctor tells you otherwise.  WHAT YOU CAN EXPECT TODAY Some feelings of bloating in the abdomen.  Passage of more gas than usual.  Spotting of blood in your stool or on the toilet paper.  IF YOU HAD POLYPS REMOVED DURING THE COLONOSCOPY: No aspirin products for 7 days or as instructed.  No alcohol for 7 days or as instructed.  Eat a soft diet for the next 24 hours.  FINDING OUT THE RESULTS OF YOUR TEST Not all test results are available during your visit. If your test results are not back during the visit, make an appointment with your caregiver to find out the  results. Do not assume everything is normal if you have not heard from your caregiver or the medical facility. It is important for you to follow up on all of your test results.  SEEK IMMEDIATE MEDICAL ATTENTION IF: You have more than a spotting of blood in your stool.  Your belly is swollen (abdominal distention).  You are nauseated or vomiting.  You have a temperature over 101.  You have abdominal pain or discomfort that is severe or gets worse throughout the day.      1 polyp removed from your colon today  Colon polyp and diverticulosis information provided   further recommendations to follow pending review of pathology report   at patient request, I called Phyllis at [phone]-reviewed findings and recommendations

## 2022-12-30 ENCOUNTER — Encounter: Payer: Self-pay | Admitting: Internal Medicine

## 2022-12-30 LAB — SURGICAL PATHOLOGY

## 2023-01-01 ENCOUNTER — Encounter (HOSPITAL_COMMUNITY): Payer: Self-pay | Admitting: Internal Medicine

## 2023-01-29 ENCOUNTER — Encounter: Payer: Self-pay | Admitting: Radiology

## 2023-05-14 DIAGNOSIS — Z125 Encounter for screening for malignant neoplasm of prostate: Secondary | ICD-10-CM | POA: Diagnosis not present

## 2023-05-14 DIAGNOSIS — R7303 Prediabetes: Secondary | ICD-10-CM | POA: Diagnosis not present

## 2023-05-14 DIAGNOSIS — I10 Essential (primary) hypertension: Secondary | ICD-10-CM | POA: Diagnosis not present

## 2023-05-14 DIAGNOSIS — E039 Hypothyroidism, unspecified: Secondary | ICD-10-CM | POA: Diagnosis not present

## 2023-05-19 DIAGNOSIS — M545 Low back pain, unspecified: Secondary | ICD-10-CM | POA: Diagnosis not present

## 2023-05-19 DIAGNOSIS — G8929 Other chronic pain: Secondary | ICD-10-CM | POA: Diagnosis not present

## 2023-05-19 DIAGNOSIS — Z6826 Body mass index (BMI) 26.0-26.9, adult: Secondary | ICD-10-CM | POA: Diagnosis not present

## 2023-05-19 DIAGNOSIS — I10 Essential (primary) hypertension: Secondary | ICD-10-CM | POA: Diagnosis not present

## 2023-05-19 DIAGNOSIS — J309 Allergic rhinitis, unspecified: Secondary | ICD-10-CM | POA: Diagnosis not present

## 2023-05-19 DIAGNOSIS — M1990 Unspecified osteoarthritis, unspecified site: Secondary | ICD-10-CM | POA: Diagnosis not present

## 2023-05-19 DIAGNOSIS — Z713 Dietary counseling and surveillance: Secondary | ICD-10-CM | POA: Diagnosis not present

## 2023-05-19 DIAGNOSIS — Z0000 Encounter for general adult medical examination without abnormal findings: Secondary | ICD-10-CM | POA: Diagnosis not present

## 2023-05-19 DIAGNOSIS — R7303 Prediabetes: Secondary | ICD-10-CM | POA: Diagnosis not present

## 2023-05-19 DIAGNOSIS — E039 Hypothyroidism, unspecified: Secondary | ICD-10-CM | POA: Diagnosis not present

## 2023-05-19 DIAGNOSIS — E782 Mixed hyperlipidemia: Secondary | ICD-10-CM | POA: Diagnosis not present

## 2023-05-19 DIAGNOSIS — M519 Unspecified thoracic, thoracolumbar and lumbosacral intervertebral disc disorder: Secondary | ICD-10-CM | POA: Diagnosis not present

## 2023-07-08 DIAGNOSIS — L729 Follicular cyst of the skin and subcutaneous tissue, unspecified: Secondary | ICD-10-CM | POA: Diagnosis not present

## 2023-07-21 DIAGNOSIS — H04123 Dry eye syndrome of bilateral lacrimal glands: Secondary | ICD-10-CM | POA: Diagnosis not present

## 2023-08-26 DIAGNOSIS — Z23 Encounter for immunization: Secondary | ICD-10-CM | POA: Diagnosis not present

## 2023-08-26 DIAGNOSIS — M1990 Unspecified osteoarthritis, unspecified site: Secondary | ICD-10-CM | POA: Diagnosis not present

## 2023-08-26 DIAGNOSIS — E039 Hypothyroidism, unspecified: Secondary | ICD-10-CM | POA: Diagnosis not present

## 2023-08-26 DIAGNOSIS — M5136 Other intervertebral disc degeneration, lumbar region: Secondary | ICD-10-CM | POA: Diagnosis not present

## 2023-08-26 DIAGNOSIS — I10 Essential (primary) hypertension: Secondary | ICD-10-CM | POA: Diagnosis not present

## 2023-08-26 DIAGNOSIS — G8929 Other chronic pain: Secondary | ICD-10-CM | POA: Diagnosis not present

## 2023-08-26 DIAGNOSIS — M545 Low back pain, unspecified: Secondary | ICD-10-CM | POA: Diagnosis not present

## 2023-08-26 DIAGNOSIS — R252 Cramp and spasm: Secondary | ICD-10-CM | POA: Diagnosis not present

## 2023-11-12 DIAGNOSIS — R7303 Prediabetes: Secondary | ICD-10-CM | POA: Diagnosis not present

## 2023-11-12 DIAGNOSIS — I10 Essential (primary) hypertension: Secondary | ICD-10-CM | POA: Diagnosis not present

## 2023-11-12 DIAGNOSIS — E039 Hypothyroidism, unspecified: Secondary | ICD-10-CM | POA: Diagnosis not present

## 2023-11-19 DIAGNOSIS — R7303 Prediabetes: Secondary | ICD-10-CM | POA: Diagnosis not present

## 2023-11-19 DIAGNOSIS — J309 Allergic rhinitis, unspecified: Secondary | ICD-10-CM | POA: Diagnosis not present

## 2023-11-19 DIAGNOSIS — D649 Anemia, unspecified: Secondary | ICD-10-CM | POA: Diagnosis not present

## 2023-11-19 DIAGNOSIS — M1990 Unspecified osteoarthritis, unspecified site: Secondary | ICD-10-CM | POA: Diagnosis not present

## 2023-11-19 DIAGNOSIS — E039 Hypothyroidism, unspecified: Secondary | ICD-10-CM | POA: Diagnosis not present

## 2023-11-19 DIAGNOSIS — D126 Benign neoplasm of colon, unspecified: Secondary | ICD-10-CM | POA: Diagnosis not present

## 2023-11-19 DIAGNOSIS — E782 Mixed hyperlipidemia: Secondary | ICD-10-CM | POA: Diagnosis not present

## 2023-11-19 DIAGNOSIS — I10 Essential (primary) hypertension: Secondary | ICD-10-CM | POA: Diagnosis not present

## 2023-11-19 DIAGNOSIS — R252 Cramp and spasm: Secondary | ICD-10-CM | POA: Diagnosis not present

## 2023-11-19 DIAGNOSIS — M519 Unspecified thoracic, thoracolumbar and lumbosacral intervertebral disc disorder: Secondary | ICD-10-CM | POA: Diagnosis not present

## 2023-11-19 DIAGNOSIS — M545 Low back pain, unspecified: Secondary | ICD-10-CM | POA: Diagnosis not present

## 2023-12-22 DIAGNOSIS — J309 Allergic rhinitis, unspecified: Secondary | ICD-10-CM | POA: Diagnosis not present

## 2023-12-22 DIAGNOSIS — R252 Cramp and spasm: Secondary | ICD-10-CM | POA: Diagnosis not present

## 2023-12-22 DIAGNOSIS — E782 Mixed hyperlipidemia: Secondary | ICD-10-CM | POA: Diagnosis not present

## 2023-12-22 DIAGNOSIS — I10 Essential (primary) hypertension: Secondary | ICD-10-CM | POA: Diagnosis not present

## 2023-12-22 DIAGNOSIS — D649 Anemia, unspecified: Secondary | ICD-10-CM | POA: Diagnosis not present

## 2023-12-22 DIAGNOSIS — M1990 Unspecified osteoarthritis, unspecified site: Secondary | ICD-10-CM | POA: Diagnosis not present

## 2023-12-22 DIAGNOSIS — E039 Hypothyroidism, unspecified: Secondary | ICD-10-CM | POA: Diagnosis not present

## 2023-12-22 DIAGNOSIS — M519 Unspecified thoracic, thoracolumbar and lumbosacral intervertebral disc disorder: Secondary | ICD-10-CM | POA: Diagnosis not present

## 2023-12-22 DIAGNOSIS — D126 Benign neoplasm of colon, unspecified: Secondary | ICD-10-CM | POA: Diagnosis not present

## 2023-12-22 DIAGNOSIS — R7303 Prediabetes: Secondary | ICD-10-CM | POA: Diagnosis not present

## 2023-12-22 DIAGNOSIS — M545 Low back pain, unspecified: Secondary | ICD-10-CM | POA: Diagnosis not present

## 2024-02-29 DIAGNOSIS — D649 Anemia, unspecified: Secondary | ICD-10-CM | POA: Diagnosis not present

## 2024-02-29 DIAGNOSIS — U071 COVID-19: Secondary | ICD-10-CM | POA: Diagnosis not present

## 2024-05-19 DIAGNOSIS — R7303 Prediabetes: Secondary | ICD-10-CM | POA: Diagnosis not present

## 2024-05-19 DIAGNOSIS — I10 Essential (primary) hypertension: Secondary | ICD-10-CM | POA: Diagnosis not present

## 2024-05-19 DIAGNOSIS — Z125 Encounter for screening for malignant neoplasm of prostate: Secondary | ICD-10-CM | POA: Diagnosis not present

## 2024-05-19 DIAGNOSIS — E039 Hypothyroidism, unspecified: Secondary | ICD-10-CM | POA: Diagnosis not present

## 2024-05-31 DIAGNOSIS — R252 Cramp and spasm: Secondary | ICD-10-CM | POA: Diagnosis not present

## 2024-05-31 DIAGNOSIS — R7303 Prediabetes: Secondary | ICD-10-CM | POA: Diagnosis not present

## 2024-05-31 DIAGNOSIS — J309 Allergic rhinitis, unspecified: Secondary | ICD-10-CM | POA: Diagnosis not present

## 2024-05-31 DIAGNOSIS — E782 Mixed hyperlipidemia: Secondary | ICD-10-CM | POA: Diagnosis not present

## 2024-05-31 DIAGNOSIS — D649 Anemia, unspecified: Secondary | ICD-10-CM | POA: Diagnosis not present

## 2024-05-31 DIAGNOSIS — M519 Unspecified thoracic, thoracolumbar and lumbosacral intervertebral disc disorder: Secondary | ICD-10-CM | POA: Diagnosis not present

## 2024-05-31 DIAGNOSIS — D126 Benign neoplasm of colon, unspecified: Secondary | ICD-10-CM | POA: Diagnosis not present

## 2024-05-31 DIAGNOSIS — E039 Hypothyroidism, unspecified: Secondary | ICD-10-CM | POA: Diagnosis not present

## 2024-05-31 DIAGNOSIS — M1990 Unspecified osteoarthritis, unspecified site: Secondary | ICD-10-CM | POA: Diagnosis not present

## 2024-05-31 DIAGNOSIS — M545 Low back pain, unspecified: Secondary | ICD-10-CM | POA: Diagnosis not present

## 2024-05-31 DIAGNOSIS — I10 Essential (primary) hypertension: Secondary | ICD-10-CM | POA: Diagnosis not present

## 2024-06-23 ENCOUNTER — Telehealth: Payer: Self-pay

## 2024-06-23 NOTE — Telephone Encounter (Signed)
 Up to date on meds, next review in August

## 2024-07-20 ENCOUNTER — Ambulatory Visit
Admission: EM | Admit: 2024-07-20 | Discharge: 2024-07-20 | Disposition: A | Discharge status: Home / Self Care | Attending: Nurse Practitioner | Admitting: Nurse Practitioner

## 2024-07-20 DIAGNOSIS — J029 Acute pharyngitis, unspecified: Secondary | ICD-10-CM | POA: Diagnosis not present

## 2024-07-20 DIAGNOSIS — J069 Acute upper respiratory infection, unspecified: Secondary | ICD-10-CM | POA: Insufficient documentation

## 2024-07-20 LAB — POC SOFIA SARS ANTIGEN FIA: SARS Coronavirus 2 Ag: NEGATIVE

## 2024-07-20 LAB — POCT RAPID STREP A (OFFICE): Rapid Strep A Screen: NEGATIVE

## 2024-07-20 MED ORDER — FLUTICASONE PROPIONATE 50 MCG/ACT NA SUSP: 2.0000 | Freq: Every day | NASAL | 0 refills | Status: AC

## 2024-07-20 MED ORDER — NYSTATIN 100000 UNIT/ML MT SUSP: 5.0000 mL | Freq: Four times a day (QID) | OROMUCOSAL | 0 refills | Status: AC | PRN

## 2024-07-20 MED ORDER — BENZONATATE 100 MG PO CAPS: 100.0000 mg | ORAL_CAPSULE | Freq: Three times a day (TID) | ORAL | 0 refills | Status: AC | PRN

## 2024-07-20 NOTE — ED Provider Notes (Signed)
 [facility]    CSN: [id number] Arrival date & time: 07/20/24  0806      History   Chief Complaint No chief complaint on file.   HPI Fernando Fisher. is a 68 y.o. male.   The history is provided by the patient.   Patient presents with a 1 day history of sore throat, cough, and postnasal drainage.  Patient denies fever, chills, headache, ear pain, wheezing, difficulty breathing, chest pain, abdominal pain, nausea, vomiting, diarrhea, or rash.  Patient states he has taken over-the-counter cough and cold medications with no relief.  He denies any obvious close sick contacts.  Past Medical History:  Diagnosis Date   GERD (gastroesophageal reflux disease)    Hypercholesterolemia    Hypertension     Patient Active Problem List   Diagnosis Date Noted   Abdominal pain 08/29/2020   Constipation 08/29/2020   Family history of colon cancer 08/29/2020   Anemia 06/12/2016   Melena 06/12/2016   Abdominal pain, epigastric 06/12/2016   History of colonic polyps    GERD 03/27/2010   ABDOMINAL PAIN, LEFT UPPER QUADRANT 03/27/2010   GASTROINTESTINAL XRAY, ABNORMAL 03/27/2010   PUD, HX OF 03/27/2010    Past Surgical History:  Procedure Laterality Date   CHOLECYSTECTOMY     COLONOSCOPY N/A 04/02/2016   RMR: diverticulosis sigmoid colon, six 5mm polyps removed, 28mm polyp in desc colon removed piecemeal using hot snare. tattooed. TCS 10/2016. tubular adenomas   COLONOSCOPY N/A 10/01/2016   Procedure: COLONOSCOPY;  Surgeon: Lamar CHRISTELLA Hollingshead, MD;  [facility];  Service: Endoscopy;  Laterality: N/A;  730   COLONOSCOPY N/A 11/02/2019   Procedure: COLONOSCOPY;  Surgeon: Hollingshead Lamar CHRISTELLA, MD;  [facility];  Service: Endoscopy;  Laterality: N/A;  8:30am   COLONOSCOPY WITH PROPOFOL  N/A 12/29/2022   Procedure: COLONOSCOPY WITH PROPOFOL ;  Surgeon: Hollingshead Lamar CHRISTELLA, MD;  [facility];  Service: Endoscopy;  Laterality: N/A;  10:30 am    ESOPHAGOGASTRODUODENOSCOPY N/A 06/18/2016   Procedure: ESOPHAGOGASTRODUODENOSCOPY (EGD);  Surgeon: Lamar CHRISTELLA Hollingshead, MD;  [facility];  Service: Endoscopy;  Laterality: N/A;  745   FOOT SURGERY     GIVENS CAPSULE STUDY N/A 06/27/2016   Procedure: GIVENS CAPSULE STUDY;  Surgeon: Lamar CHRISTELLA Hollingshead, MD;  [facility];  Service: Endoscopy;  Laterality: N/A;  0700   POLYPECTOMY  11/02/2019   Procedure: POLYPECTOMY;  Surgeon: Hollingshead Lamar CHRISTELLA, MD;  [facility];  Service: Endoscopy;;  cecal,rectal   POLYPECTOMY  12/29/2022   Procedure: POLYPECTOMY;  Surgeon: Hollingshead Lamar CHRISTELLA, MD;  [facility];  Service: Endoscopy;;       Home Medications    Prior to Admission medications   Medication Sig Start Date End Date Taking? Authorizing Provider  benzonatate  (TESSALON ) 100 MG capsule Take 1 capsule (100 mg total) by mouth 3 (three) times daily as needed for up to 10 days for cough. 07/20/24 07/30/24 Yes Leath-Warren, Etta PARAS, NP  fluticasone  (FLONASE ) 50 MCG/ACT nasal spray Place 2 sprays into both nostrils daily. 07/20/24  Yes Leath-Warren, Etta PARAS, NP  magic mouthwash (nystatin , hydrocortisone, diphenhydrAMINE, lidocaine ) suspension Swish and swallow 5 mLs 4 (four) times daily as needed for up to 10 days for mouth pain. 07/20/24 07/30/24 Yes Leath-Warren, Etta PARAS, NP  aspirin EC 81 MG tablet Take 81 mg by mouth daily.     [provider]  Cholecalciferol (VITAMIN D) 50 MCG (2000 UT) CAPS Take 2,000  Units by mouth at bedtime.     [provider]  Cyanocobalamin  (VITAMIN B12 PO) Take 1 tablet by mouth daily.    [provider]  diclofenac (VOLTAREN) 75 MG EC tablet Take 75 mg by mouth daily as needed for mild pain or moderate pain.    [provider]  diclofenac Sodium (VOLTAREN ARTHRITIS PAIN) 1 % GEL Apply 2 g topically 4 (four) times daily.    [provider]  hydrochlorothiazide (HYDRODIURIL) 25 MG tablet Take 25 mg by  mouth daily.     [provider]  ibuprofen  (ADVIL ) 600 MG tablet Take 1 tablet (600 mg total) by mouth every 6 (six) hours as needed. Patient taking differently: Take 600 mg by mouth every 6 (six) hours as needed for mild pain or moderate pain. 04/11/22   Mortenson, Ashley, MD  levothyroxine (SYNTHROID) 50 MCG tablet Take 50 mcg by mouth daily. 06/24/19   [provider]  methocarbamol (ROBAXIN) 750 MG tablet Take 750 mg by mouth every 6 (six) hours as needed for muscle spasms.    [provider]  Multiple Vitamin (MULTIVITAMIN) tablet Take 1 tablet by mouth daily. 50 +    [provider]  Omega-3 Fatty Acids (FISH OIL PO) Take 720 mg by mouth every evening.    [provider]  Probiotic Product (PROBIOTIC PO) Take 1 capsule by mouth daily. Digestive advantage    [provider]  simvastatin (ZOCOR) 20 MG tablet Take 20 mg by mouth at bedtime.     [provider]  Sod Picosulfate-Mag Ox-Cit Acd (CLENPIQ ) 10-3.5-12 MG-GM -GM/175ML SOLN Take 1 kit by mouth as directed. 12/08/22   Rourk, Lamar HERO, MD  zinc gluconate 50 MG tablet Take 50 mg by mouth at bedtime.    [provider]    Family History Family History  Problem Relation Age of Onset   Colon cancer Mother        early 46s   Colon cancer Maternal Aunt     Social History Social History   Tobacco Use   Smoking status: Never   Smokeless tobacco: Never   Tobacco comments:    Never smoked  Substance Use Topics   Alcohol use: No    Alcohol/week: 0.0 standard drinks of alcohol   Drug use: No     Allergies   Codeine, Hydrocodone-acetaminophen, Iron, and Latex   Review of Systems Review of Systems Per HPI  Physical Exam Triage Vital Signs ED Triage Vitals  Encounter Vitals Group     BP 07/20/24 0824 130/73     Girls Systolic BP Percentile --      Girls Diastolic BP Percentile --      Boys Systolic BP Percentile --      Boys Diastolic BP Percentile --       Pulse Rate 07/20/24 0824 65     Resp 07/20/24 0824 18     Temp 07/20/24 0824 (!) 97.5 F (36.4 C)     Temp Source 07/20/24 0824 Oral     SpO2 07/20/24 0824 98 %     Weight --      Height --      Head Circumference --      Peak Flow --      Pain Score 07/20/24 0830 7     Pain Loc --      Pain Education --      Exclude from Growth Chart --    No data found.  Updated  Vital Signs BP 130/73 (BP Location: Right Arm)   Pulse 65   Temp (!) 97.5 F (36.4 C) (Oral)   Resp 18   SpO2 98%   Visual Acuity Right Eye Distance:   Left Eye Distance:   Bilateral Distance:    Right Eye Near:   Left Eye Near:    Bilateral Near:     Physical Exam Vitals and nursing note reviewed.  Constitutional:      General: He is not in acute distress.    Appearance: Normal appearance.  HENT:     Head: Normocephalic.     Right Ear: Tympanic membrane, ear canal and external ear normal.     Left Ear: Tympanic membrane, ear canal and external ear normal.     Nose: Congestion present.     Right Turbinates: Enlarged and swollen.     Left Turbinates: Enlarged and swollen.     Right Sinus: No maxillary sinus tenderness or frontal sinus tenderness.     Left Sinus: No maxillary sinus tenderness or frontal sinus tenderness.     Mouth/Throat:     Lips: Pink.     Mouth: Mucous membranes are moist.     Pharynx: Uvula midline. Pharyngeal swelling, posterior oropharyngeal erythema and postnasal drip present. No oropharyngeal exudate or uvula swelling.     Tonsils: 1+ on the right. 1+ on the left.  Eyes:     Extraocular Movements: Extraocular movements intact.     Conjunctiva/sclera: Conjunctivae normal.     Pupils: Pupils are equal, round, and reactive to light.  Cardiovascular:     Rate and Rhythm: Normal rate and regular rhythm.     Pulses: Normal pulses.     Heart sounds: Normal heart sounds.  Pulmonary:     Effort: Pulmonary effort is normal. No respiratory distress.     Breath sounds: Normal  breath sounds. No stridor. No wheezing, rhonchi or rales.  Abdominal:     General: Bowel sounds are normal.     Palpations: Abdomen is soft.     Tenderness: There is no abdominal tenderness.  Musculoskeletal:     Cervical back: Normal range of motion.  Lymphadenopathy:     Cervical: No cervical adenopathy.  Skin:    General: Skin is warm and dry.  Neurological:     General: No focal deficit present.     Mental Status: He is alert and oriented to person, place, and time.  Psychiatric:        Mood and Affect: Mood normal.        Behavior: Behavior normal.      UC Treatments / Results  Labs (all labs ordered are listed, but only abnormal results are displayed) Labs Reviewed  CULTURE, GROUP A STREP [facility])  POCT RAPID STREP A (OFFICE)  POC SOFIA SARS ANTIGEN FIA    EKG   Radiology No results found.  Procedures Procedures (including critical care time)  Medications Ordered in UC Medications - No data to display  Initial Impression / Assessment and Plan / UC Course  I have reviewed the triage vital signs and the nursing notes.  Pertinent labs & imaging results that were available during my care of the patient were reviewed by me and considered in my medical decision making (see chart for details).  The rapid strep test and COVID test were negative.  A throat culture has been ordered.  The patient is well-appearing, he is in no acute distress, vital signs are stable.  On exam, lung sounds  are clear throughout, room air sats at 98%.  No indication for imaging at this time.  Symptoms consistent with viral etiology.  Will provide symptomatic treatment with benzonatate  100 mg for his cough, Magic mouthwash for throat pain and discomfort, and fluticasone  50 mcg nasal spray.  Supportive care recommendations were provided and discussed with the patient to include fluids, rest, over-the-counter Tylenol, warm salt water  gargles, and use of a humidifier during sleep.  Discussed  indications with patient regarding follow-up.  Patient is in agreement with this plan of care and verbalizes understanding.  All questions were answered.  Patient stable for discharge.   Final Clinical Impressions(s) / UC Diagnoses   Final diagnoses:  Sore throat  Viral URI with cough     Discharge Instructions      The rapid strep test and COVID test were negative.  A throat culture has been ordered.  You will be contacted if the pending test result is abnormal.  You will also have access to your results via MyChart. Take medication as prescribed. Increase fluids and allow for plenty of rest. You may take over-the-counter Tylenol as needed for pain, fever, or general discomfort. Warm salt water  gargles 3-4 times daily as needed for throat pain or discomfort.  You may also use over-the-counter Chloraseptic throat spray or throat lozenges while symptoms persist. Normal saline nasal spray throughout the day for nasal congestion or runny nose. For your cough, it may be helpful to use a humidifier in your bedroom at nighttime during sleep while symptoms persist. Symptoms should improve over the next 5 to 7 days.  If symptoms fail to improve, or appear to be worsening, you may follow-up in this clinic or with your primary care physician for further evaluation. Follow-up as needed.     ED Prescriptions     Medication Sig Dispense Auth. Provider   magic mouthwash (nystatin , hydrocortisone, diphenhydrAMINE, lidocaine ) suspension Swish and swallow 5 mLs 4 (four) times daily as needed for up to 10 days for mouth pain. 200 mL Leath-Warren, Etta PARAS, NP   benzonatate  (TESSALON ) 100 MG capsule Take 1 capsule (100 mg total) by mouth 3 (three) times daily as needed for up to 10 days for cough. 30 capsule Leath-Warren, Etta PARAS, NP   fluticasone  (FLONASE ) 50 MCG/ACT nasal spray Place 2 sprays into both nostrils daily. 16 g Leath-Warren, Etta PARAS, NP      PDMP not reviewed this encounter.    Gilmer Etta PARAS, NP 07/20/24 [phone]

## 2024-07-20 NOTE — Discharge Instructions (Signed)
 The rapid strep test and COVID test were negative.  A throat culture has been ordered.  You will be contacted if the pending test result is abnormal.  You will also have access to your results via MyChart. Take medication as prescribed. Increase fluids and allow for plenty of rest. You may take over-the-counter Tylenol as needed for pain, fever, or general discomfort. Warm salt water  gargles 3-4 times daily as needed for throat pain or discomfort.  You may also use over-the-counter Chloraseptic throat spray or throat lozenges while symptoms persist. Normal saline nasal spray throughout the day for nasal congestion or runny nose. For your cough, it may be helpful to use a humidifier in your bedroom at nighttime during sleep while symptoms persist. Symptoms should improve over the next 5 to 7 days.  If symptoms fail to improve, or appear to be worsening, you may follow-up in this clinic or with your primary care physician for further evaluation. Follow-up as needed.

## 2024-07-20 NOTE — ED Triage Notes (Signed)
 Pt report sore throat and cough  onset yesterday afternoon and   worsened  over night, pt has tried [employer] plus and Nyquil found no relief with OTC medications.

## 2024-07-23 LAB — CULTURE, GROUP A STREP (THRC)

## 2024-08-02 DIAGNOSIS — M79672 Pain in left foot: Secondary | ICD-10-CM | POA: Diagnosis not present

## 2024-08-08 DIAGNOSIS — Z1283 Encounter for screening for malignant neoplasm of skin: Secondary | ICD-10-CM | POA: Diagnosis not present

## 2024-08-08 DIAGNOSIS — D485 Neoplasm of uncertain behavior of skin: Secondary | ICD-10-CM | POA: Diagnosis not present

## 2024-08-08 DIAGNOSIS — D225 Melanocytic nevi of trunk: Secondary | ICD-10-CM | POA: Diagnosis not present

## 2024-08-23 DIAGNOSIS — D485 Neoplasm of uncertain behavior of skin: Secondary | ICD-10-CM | POA: Diagnosis not present

## 2024-08-23 DIAGNOSIS — L988 Other specified disorders of the skin and subcutaneous tissue: Secondary | ICD-10-CM | POA: Diagnosis not present

## 2024-08-31 DIAGNOSIS — Z0000 Encounter for general adult medical examination without abnormal findings: Secondary | ICD-10-CM | POA: Diagnosis not present

## 2024-09-20 DIAGNOSIS — D485 Neoplasm of uncertain behavior of skin: Secondary | ICD-10-CM | POA: Diagnosis not present

## 2024-09-20 DIAGNOSIS — D225 Melanocytic nevi of trunk: Secondary | ICD-10-CM | POA: Diagnosis not present

## 2024-10-05 DIAGNOSIS — D485 Neoplasm of uncertain behavior of skin: Secondary | ICD-10-CM | POA: Diagnosis not present
